# Patient Record
Sex: Female | Born: 1985 | Race: White | Hispanic: No | Marital: Married | Smoking: Former smoker
Health system: Southern US, Community
[De-identification: ages and names within clinical notes are randomized; demographics above are authoritative.]

## PROBLEM LIST (undated history)

## (undated) DIAGNOSIS — K219 Gastro-esophageal reflux disease without esophagitis: Secondary | ICD-10-CM

## (undated) DIAGNOSIS — F329 Major depressive disorder, single episode, unspecified: Secondary | ICD-10-CM

## (undated) DIAGNOSIS — R569 Unspecified convulsions: Secondary | ICD-10-CM

## (undated) DIAGNOSIS — T7840XA Allergy, unspecified, initial encounter: Secondary | ICD-10-CM

## (undated) DIAGNOSIS — F419 Anxiety disorder, unspecified: Secondary | ICD-10-CM

## (undated) DIAGNOSIS — N200 Calculus of kidney: Secondary | ICD-10-CM

## (undated) DIAGNOSIS — Z8041 Family history of malignant neoplasm of ovary: Secondary | ICD-10-CM

## (undated) DIAGNOSIS — R519 Headache, unspecified: Secondary | ICD-10-CM

## (undated) DIAGNOSIS — J45909 Unspecified asthma, uncomplicated: Secondary | ICD-10-CM

## (undated) DIAGNOSIS — F32A Depression, unspecified: Secondary | ICD-10-CM

## (undated) DIAGNOSIS — R51 Headache: Secondary | ICD-10-CM

## (undated) DIAGNOSIS — Z800 Family history of malignant neoplasm of digestive organs: Secondary | ICD-10-CM

## (undated) DIAGNOSIS — Z803 Family history of malignant neoplasm of breast: Secondary | ICD-10-CM

## (undated) HISTORY — DX: Major depressive disorder, single episode, unspecified: F32.9

## (undated) HISTORY — DX: Allergy, unspecified, initial encounter: T78.40XA

## (undated) HISTORY — DX: Headache, unspecified: R51.9

## (undated) HISTORY — DX: Gastro-esophageal reflux disease without esophagitis: K21.9

## (undated) HISTORY — DX: Anxiety disorder, unspecified: F41.9

## (undated) HISTORY — DX: Family history of malignant neoplasm of ovary: Z80.41

## (undated) HISTORY — DX: Family history of malignant neoplasm of digestive organs: Z80.0

## (undated) HISTORY — DX: Family history of malignant neoplasm of breast: Z80.3

## (undated) HISTORY — DX: Depression, unspecified: F32.A

## (undated) HISTORY — DX: Calculus of kidney: N20.0

## (undated) HISTORY — PX: TUBAL LIGATION: SHX77

## (undated) HISTORY — PX: CHOLECYSTECTOMY: SHX55

## (undated) HISTORY — DX: Headache: R51

---
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
## 2004-09-17 ENCOUNTER — Emergency Department: Payer: Self-pay | Admitting: Emergency Medicine

## 2004-12-08 ENCOUNTER — Emergency Department: Payer: Self-pay | Admitting: Unknown Physician Specialty

## 2005-02-03 ENCOUNTER — Emergency Department: Payer: Self-pay | Admitting: Emergency Medicine

## 2005-02-04 ENCOUNTER — Ambulatory Visit: Payer: Self-pay | Admitting: Emergency Medicine

## 2005-09-15 ENCOUNTER — Observation Stay: Payer: Self-pay

## 2005-09-19 ENCOUNTER — Observation Stay: Payer: Self-pay | Admitting: Obstetrics & Gynecology

## 2005-09-29 ENCOUNTER — Inpatient Hospital Stay: Payer: Self-pay | Admitting: Obstetrics & Gynecology

## 2006-07-05 ENCOUNTER — Emergency Department: Payer: Self-pay | Admitting: Emergency Medicine

## 2006-12-22 ENCOUNTER — Emergency Department: Payer: Self-pay | Admitting: Emergency Medicine

## 2007-02-21 ENCOUNTER — Emergency Department: Payer: Self-pay | Admitting: Internal Medicine

## 2007-03-01 ENCOUNTER — Emergency Department: Payer: Self-pay | Admitting: Emergency Medicine

## 2008-01-10 ENCOUNTER — Emergency Department: Payer: Self-pay | Admitting: Emergency Medicine

## 2009-03-15 ENCOUNTER — Emergency Department: Payer: Self-pay | Admitting: Emergency Medicine

## 2009-05-29 ENCOUNTER — Emergency Department: Payer: Self-pay | Admitting: Emergency Medicine

## 2012-04-20 HISTORY — PX: WISDOM TOOTH EXTRACTION: SHX21

## 2012-05-24 ENCOUNTER — Emergency Department: Payer: Self-pay | Admitting: Emergency Medicine

## 2012-08-30 ENCOUNTER — Emergency Department: Payer: Self-pay | Admitting: Emergency Medicine

## 2012-08-30 LAB — URINALYSIS, COMPLETE
Bacteria: NONE SEEN
Bilirubin,UR: NEGATIVE
Glucose,UR: NEGATIVE mg/dL (ref 0–75)
Leukocyte Esterase: NEGATIVE
Nitrite: NEGATIVE
Ph: 8 (ref 4.5–8.0)
Protein: NEGATIVE
RBC,UR: <1 /HPF (ref 0–5)
Specific Gravity: 1.018 (ref 1.003–1.030)
Squamous Epithelial: 4
WBC UR: 1 /HPF (ref 0–5)

## 2012-08-30 LAB — PREGNANCY, URINE: Pregnancy Test, Urine: NEGATIVE m[IU]/mL

## 2012-08-30 LAB — COMPREHENSIVE METABOLIC PANEL
Albumin: 3.6 g/dL (ref 3.4–5.0)
Alkaline Phosphatase: 70 U/L (ref 50–136)
Anion Gap: 5 — ABNORMAL LOW (ref 7–16)
BUN: 11 mg/dL (ref 7–18)
Bilirubin,Total: 0.3 mg/dL (ref 0.2–1.0)
Co2: 26 mmol/L (ref 21–32)
EGFR (African American): >60
EGFR (Non-African Amer.): >60
Glucose: 97 mg/dL (ref 65–99)
Osmolality: 281 (ref 275–301)
Potassium: 4.1 mmol/L (ref 3.5–5.1)
Sodium: 141 mmol/L (ref 136–145)
Total Protein: 7.1 g/dL (ref 6.4–8.2)

## 2012-08-30 LAB — CBC
MCHC: 34.3 g/dL (ref 32.0–36.0)
MCV: 90 fL (ref 80–100)
Platelet: 316 10*3/uL (ref 150–440)
RBC: 4.01 10*6/uL (ref 3.80–5.20)
WBC: 7 10*3/uL (ref 3.6–11.0)

## 2012-10-09 ENCOUNTER — Emergency Department: Payer: Self-pay | Admitting: Emergency Medicine

## 2012-10-09 LAB — DRUG SCREEN, URINE
Amphetamines, Ur Screen: NEGATIVE (ref ?–1000)
Barbiturates, Ur Screen: NEGATIVE (ref ?–200)
Benzodiazepine, Ur Scrn: NEGATIVE (ref ?–200)
Cannabinoid 50 Ng, Ur ~~LOC~~: NEGATIVE (ref ?–50)
Cocaine Metabolite,Ur ~~LOC~~: NEGATIVE (ref ?–300)
Methadone, Ur Screen: NEGATIVE (ref ?–300)
Opiate, Ur Screen: NEGATIVE (ref ?–300)
Phencyclidine (PCP) Ur S: NEGATIVE (ref ?–25)

## 2012-10-09 LAB — URINALYSIS, COMPLETE
Bacteria: NONE SEEN
Bilirubin,UR: NEGATIVE
Blood: NEGATIVE
Glucose,UR: NEGATIVE mg/dL (ref 0–75)
Nitrite: NEGATIVE
Ph: 5 (ref 4.5–8.0)
RBC,UR: NONE SEEN /HPF (ref 0–5)
WBC UR: 1 /HPF (ref 0–5)

## 2012-10-09 LAB — CBC
HCT: 35.9 % (ref 35.0–47.0)
MCH: 30.3 pg (ref 26.0–34.0)
MCV: 90 fL (ref 80–100)
Platelet: 300 10*3/uL (ref 150–440)
RBC: 4.01 10*6/uL (ref 3.80–5.20)
RDW: 13.6 % (ref 11.5–14.5)
WBC: 8.9 10*3/uL (ref 3.6–11.0)

## 2012-10-09 LAB — COMPREHENSIVE METABOLIC PANEL
BUN: 15 mg/dL (ref 7–18)
Calcium, Total: 9 mg/dL (ref 8.5–10.1)
Chloride: 105 mmol/L (ref 98–107)
Co2: 27 mmol/L (ref 21–32)
Creatinine: 0.92 mg/dL (ref 0.60–1.30)
Potassium: 3.7 mmol/L (ref 3.5–5.1)
SGOT(AST): 14 U/L — ABNORMAL LOW (ref 15–37)
SGPT (ALT): 23 U/L (ref 12–78)
Sodium: 141 mmol/L (ref 136–145)

## 2012-10-09 LAB — ETHANOL: Ethanol %: <0.003 % (ref 0.000–0.080)

## 2012-10-09 LAB — ACETAMINOPHEN LEVEL: Acetaminophen: <2 ug/mL

## 2012-10-09 LAB — TSH: Thyroid Stimulating Horm: 3.42 u[IU]/mL

## 2013-02-01 ENCOUNTER — Emergency Department: Payer: Self-pay | Admitting: Emergency Medicine

## 2013-02-01 LAB — URINALYSIS, COMPLETE
Nitrite: NEGATIVE
Ph: 9 (ref 4.5–8.0)
Protein: NEGATIVE
RBC,UR: 11 /HPF (ref 0–5)
Specific Gravity: 1.017 (ref 1.003–1.030)
Squamous Epithelial: 21
WBC UR: NONE SEEN /HPF (ref 0–5)

## 2013-02-01 LAB — COMPREHENSIVE METABOLIC PANEL
Albumin: 3.4 g/dL (ref 3.4–5.0)
Alkaline Phosphatase: 80 U/L (ref 50–136)
Anion Gap: 2 — ABNORMAL LOW (ref 7–16)
BUN: 8 mg/dL (ref 7–18)
Bilirubin,Total: 0.2 mg/dL (ref 0.2–1.0)
Calcium, Total: 8.9 mg/dL (ref 8.5–10.1)
Chloride: 107 mmol/L (ref 98–107)
Co2: 28 mmol/L (ref 21–32)
Creatinine: 0.71 mg/dL (ref 0.60–1.30)
Glucose: 87 mg/dL (ref 65–99)
Osmolality: 272 (ref 275–301)
SGOT(AST): 23 U/L (ref 15–37)
Sodium: 137 mmol/L (ref 136–145)

## 2013-02-01 LAB — CBC
HGB: 12 g/dL (ref 12.0–16.0)
MCHC: 35 g/dL (ref 32.0–36.0)
MCV: 90 fL (ref 80–100)
Platelet: 307 10*3/uL (ref 150–440)

## 2013-02-01 LAB — LIPASE, BLOOD: Lipase: 93 U/L (ref 73–393)

## 2013-02-09 ENCOUNTER — Emergency Department: Payer: Self-pay | Admitting: Emergency Medicine

## 2013-04-10 ENCOUNTER — Ambulatory Visit (INDEPENDENT_AMBULATORY_CARE_PROVIDER_SITE_OTHER): Payer: Medicaid Other | Admitting: General Surgery

## 2013-04-10 ENCOUNTER — Encounter: Payer: Self-pay | Admitting: General Surgery

## 2013-04-10 VITALS — BP 140/80 | HR 78 | Resp 14 | Ht 60.0 in | Wt 161.0 lb

## 2013-04-10 DIAGNOSIS — K8010 Calculus of gallbladder with chronic cholecystitis without obstruction: Secondary | ICD-10-CM

## 2013-04-10 NOTE — Progress Notes (Signed)
Patient ID: Laura Petersen, female   DOB: [birth year], 27 y.o.   MRN: [id number]  Chief Complaint  Patient presents with  . Other    gallstones    HPI Laura Petersen is a 27 y.o. female here today for a evaluation of gallstones .Patient had a ultrasound done about three months ago. She states she is having abdominal pain up under her right rib.Patient states she gets attacks three times a month, the pain last for about 2-3 hours.  HPI  Past Medical History  Diagnosis Date  . Depression   . GERD (gastroesophageal reflux disease)   . Allergy   . Kidney stones   . Headache   . Anxiety     Past Surgical History  Procedure Laterality Date  . Cesarean section    . Wisdom tooth extraction  2014    Family History  Problem Relation Age of Onset  . Breast cancer Maternal Aunt   . Breast cancer Paternal Aunt   . Cervical cancer Mother     Social History History  Substance Use Topics  . Smoking status: Former Smoker -- 1.00 packs/day for 6 years  . Smokeless tobacco: Never Used  . Alcohol Use: Yes    No Known Allergies  Current Outpatient Prescriptions  Medication Sig Dispense Refill  . clonazePAM (KLONOPIN) 0.5 MG tablet Take 0.5 mg by mouth 2 (two) times daily as needed for anxiety.      . DULoxetine (CYMBALTA) 30 MG capsule Take 30 mg by mouth daily.       No current facility-administered medications for this visit.    Review of Systems Review of Systems  Constitutional: Negative.   Respiratory: Negative.   Cardiovascular: Negative.   Gastrointestinal: Positive for nausea, abdominal pain and diarrhea. Negative for vomiting, blood in stool, abdominal distention, anal bleeding and rectal pain.    Blood pressure 140/80, pulse 78, resp. rate 14, height 5' (1.524 m), weight 161 lb (73.029 kg), last menstrual period 04/10/2013.  Physical Exam Physical Exam  Constitutional: She is oriented to person, place, and time. She appears well-developed and well-nourished.   Eyes: No scleral icterus.  Neck: No mass and no thyromegaly present.  Cardiovascular: Normal rate, regular rhythm and normal heart sounds.   Pulmonary/Chest: Breath sounds normal.  Abdominal: Soft. Normal appearance and bowel sounds are normal. There is no hepatosplenomegaly. There is no tenderness. No hernia.  Lymphadenopathy:    She has no cervical adenopathy.  Neurological: She is alert and oriented to person, place, and time.  Skin: Skin is warm and dry.    Data Reviewed Notes and ultrasound report reviewed. Gallstones noted.  Assessment    Symptomatic cholelithiasis     Plan    Discuss cholecystectomy. Reasons, procedure, risks and benefits explained. Pt is agreeable.    Patient's surgery has been scheduled for 04-19-13 at [facility].   Govind Furey G 04/11/2013, 5:57 AM

## 2013-04-10 NOTE — Patient Instructions (Addendum)

## 2013-04-11 ENCOUNTER — Encounter: Payer: Self-pay | Admitting: General Surgery

## 2013-04-11 ENCOUNTER — Other Ambulatory Visit: Payer: Self-pay | Admitting: *Deleted

## 2013-04-11 ENCOUNTER — Other Ambulatory Visit: Payer: Self-pay | Admitting: General Surgery

## 2013-04-11 DIAGNOSIS — K8010 Calculus of gallbladder with chronic cholecystitis without obstruction: Secondary | ICD-10-CM

## 2013-04-11 LAB — HEPATIC FUNCTION PANEL
AST: 21 U/L
Albumin: 4
Total Bilirubin: 0.2 mg/dL
Total Protein: 6.1 g/dL

## 2013-04-11 LAB — LIPASE: Lipase: 40 units/L (ref 0–53)

## 2013-04-11 LAB — CBC WITH DIFFERENTIAL
HCT: 34 %
MCHC: 33.1
RDW: 13.7

## 2013-04-11 NOTE — Progress Notes (Signed)
Patient will have the following labs drawn at [facility] lab today: CBC, Liver A, and Lipase.   She will be stopping by the office and will need to be walked back to the lab.

## 2013-04-12 LAB — HEPATIC FUNCTION PANEL
Albumin: 4 g/dL (ref 3.5–5.5)
Alkaline Phosphatase: 83 IU/L (ref 39–117)
Bilirubin, Direct: 0.05 mg/dL (ref 0.00–0.40)
Total Protein: 6.1 g/dL (ref 6.0–8.5)

## 2013-04-12 LAB — CBC WITH DIFFERENTIAL
Basophils Absolute: 0 10*3/uL (ref 0.0–0.2)
Basos: 0 %
HCT: 34.1 % (ref 34.0–46.6)
Hemoglobin: 11.3 g/dL (ref 11.1–15.9)
Lymphs: 37 %
MCH: 29.2 pg (ref 26.6–33.0)
MCHC: 33.1 g/dL (ref 31.5–35.7)
MCV: 88 fL (ref 79–97)
Monocytes Absolute: 0.4 10*3/uL (ref 0.1–0.9)
Monocytes: 7 %
Neutrophils Absolute: 3.5 10*3/uL (ref 1.4–7.0)
Platelets: 435 10*3/uL — ABNORMAL HIGH (ref 150–379)
RDW: 13.7 % (ref 12.3–15.4)
WBC: 6.4 10*3/uL (ref 3.4–10.8)

## 2013-04-19 ENCOUNTER — Ambulatory Visit: Payer: Self-pay | Admitting: General Surgery

## 2013-04-19 DIAGNOSIS — K8010 Calculus of gallbladder with chronic cholecystitis without obstruction: Secondary | ICD-10-CM

## 2013-04-21 LAB — PATHOLOGY REPORT

## 2013-04-24 ENCOUNTER — Encounter: Payer: Self-pay | Admitting: General Surgery

## 2013-04-26 ENCOUNTER — Ambulatory Visit: Payer: Medicaid Other | Admitting: General Surgery

## 2013-06-24 ENCOUNTER — Emergency Department: Payer: Self-pay | Admitting: Emergency Medicine

## 2013-06-24 LAB — CBC
HCT: 36.2 % (ref 35.0–47.0)
HGB: 11.9 g/dL — AB (ref 12.0–16.0)
MCH: 28.7 pg (ref 26.0–34.0)
MCHC: 32.8 g/dL (ref 32.0–36.0)
MCV: 88 fL (ref 80–100)
Platelet: 381 10*3/uL (ref 150–440)
RBC: 4.13 10*6/uL (ref 3.80–5.20)
RDW: 15.3 % — ABNORMAL HIGH (ref 11.5–14.5)
WBC: 9.2 10*3/uL (ref 3.6–11.0)

## 2013-06-24 LAB — DRUG SCREEN, URINE
AMPHETAMINES, UR SCREEN: NEGATIVE (ref ?–1000)
BENZODIAZEPINE, UR SCRN: NEGATIVE (ref ?–200)
Barbiturates, Ur Screen: NEGATIVE (ref ?–200)
CANNABINOID 50 NG, UR ~~LOC~~: NEGATIVE (ref ?–50)
COCAINE METABOLITE, UR ~~LOC~~: NEGATIVE (ref ?–300)
MDMA (ECSTASY) UR SCREEN: NEGATIVE (ref ?–500)
Methadone, Ur Screen: NEGATIVE (ref ?–300)
OPIATE, UR SCREEN: NEGATIVE (ref ?–300)
Phencyclidine (PCP) Ur S: NEGATIVE (ref ?–25)
Tricyclic, Ur Screen: POSITIVE (ref ?–1000)

## 2013-06-24 LAB — COMPREHENSIVE METABOLIC PANEL
ALK PHOS: 80 U/L
ALT: 26 U/L (ref 12–78)
Albumin: 4.2 g/dL (ref 3.4–5.0)
Anion Gap: 12 (ref 7–16)
BILIRUBIN TOTAL: 0.3 mg/dL (ref 0.2–1.0)
BUN: 12 mg/dL (ref 7–18)
Calcium, Total: 8.5 mg/dL (ref 8.5–10.1)
Chloride: 108 mmol/L — ABNORMAL HIGH (ref 98–107)
Co2: 27 mmol/L (ref 21–32)
Creatinine: 0.85 mg/dL (ref 0.60–1.30)
EGFR (African American): >60
GLUCOSE: 63 mg/dL — AB (ref 65–99)
Osmolality: 290 (ref 275–301)
Potassium: 3.4 mmol/L — ABNORMAL LOW (ref 3.5–5.1)
SGOT(AST): 21 U/L (ref 15–37)
SODIUM: 147 mmol/L — AB (ref 136–145)
TOTAL PROTEIN: 7.7 g/dL (ref 6.4–8.2)

## 2013-06-24 LAB — SALICYLATE LEVEL: Salicylates, Serum: <1.7 mg/dL

## 2013-06-24 LAB — URINALYSIS, COMPLETE
BLOOD: NEGATIVE
Bilirubin,UR: NEGATIVE
GLUCOSE, UR: NEGATIVE mg/dL (ref 0–75)
Ketone: NEGATIVE
Leukocyte Esterase: NEGATIVE
Nitrite: NEGATIVE
Ph: 5 (ref 4.5–8.0)
Protein: NEGATIVE
RBC,UR: 1 /HPF (ref 0–5)
Specific Gravity: 1.011 (ref 1.003–1.030)
WBC UR: <1 /HPF (ref 0–5)

## 2013-06-24 LAB — ETHANOL: Ethanol %: <0.003 % (ref 0.000–0.080)

## 2013-06-24 LAB — ACETAMINOPHEN LEVEL: Acetaminophen: <2 ug/mL

## 2013-06-24 LAB — TSH: Thyroid Stimulating Horm: 2.92 u[IU]/mL

## 2013-06-24 LAB — PREGNANCY, URINE: Pregnancy Test, Urine: NEGATIVE m[IU]/mL

## 2013-12-16 ENCOUNTER — Emergency Department: Payer: Self-pay | Admitting: Emergency Medicine

## 2013-12-16 LAB — BASIC METABOLIC PANEL
ANION GAP: 8 (ref 7–16)
BUN: 10 mg/dL (ref 7–18)
CALCIUM: 8.2 mg/dL — AB (ref 8.5–10.1)
CO2: 24 mmol/L (ref 21–32)
CREATININE: 0.83 mg/dL (ref 0.60–1.30)
Chloride: 108 mmol/L — ABNORMAL HIGH (ref 98–107)
EGFR (Non-African Amer.): >60
Glucose: 108 mg/dL — ABNORMAL HIGH (ref 65–99)
OSMOLALITY: 279 (ref 275–301)
POTASSIUM: 3.6 mmol/L (ref 3.5–5.1)
Sodium: 140 mmol/L (ref 136–145)

## 2013-12-16 LAB — CBC WITH DIFFERENTIAL/PLATELET
BASOS PCT: 0.8 %
Basophil #: 0.1 10*3/uL (ref 0.0–0.1)
Eosinophil #: 0 10*3/uL (ref 0.0–0.7)
Eosinophil %: 0.3 %
HCT: 35 % (ref 35.0–47.0)
HGB: 11.5 g/dL — AB (ref 12.0–16.0)
Lymphocyte #: 2.8 10*3/uL (ref 1.0–3.6)
Lymphocyte %: 24.4 %
MCH: 28.5 pg (ref 26.0–34.0)
MCHC: 32.9 g/dL (ref 32.0–36.0)
MCV: 87 fL (ref 80–100)
MONOS PCT: 5 %
Monocyte #: 0.6 x10 3/mm (ref 0.2–0.9)
Neutrophil #: 8 10*3/uL — ABNORMAL HIGH (ref 1.4–6.5)
Neutrophil %: 69.5 %
Platelet: 439 10*3/uL (ref 150–440)
RBC: 4.04 10*6/uL (ref 3.80–5.20)
RDW: 15.3 % — ABNORMAL HIGH (ref 11.5–14.5)
WBC: 11.5 10*3/uL — ABNORMAL HIGH (ref 3.6–11.0)

## 2013-12-17 LAB — TROPONIN I: Troponin-I: <0.02 ng/mL

## 2014-02-12 ENCOUNTER — Emergency Department: Payer: Self-pay | Admitting: Emergency Medicine

## 2014-02-12 LAB — URINALYSIS, COMPLETE
Bilirubin,UR: NEGATIVE
Blood: NEGATIVE
GLUCOSE, UR: NEGATIVE mg/dL (ref 0–75)
LEUKOCYTE ESTERASE: NEGATIVE
NITRITE: NEGATIVE
Ph: 5 (ref 4.5–8.0)
Protein: 30
RBC,UR: 1 /HPF (ref 0–5)
SPECIFIC GRAVITY: 1.026 (ref 1.003–1.030)
Squamous Epithelial: 13
WBC UR: 3 /HPF (ref 0–5)

## 2014-02-12 LAB — CBC WITH DIFFERENTIAL/PLATELET
Basophil #: 0 10*3/uL (ref 0.0–0.1)
Basophil %: 0.3 %
EOS PCT: 0.4 %
Eosinophil #: 0 10*3/uL (ref 0.0–0.7)
HCT: 36.5 % (ref 35.0–47.0)
HGB: 11.8 g/dL — ABNORMAL LOW (ref 12.0–16.0)
Lymphocyte #: 1.8 10*3/uL (ref 1.0–3.6)
Lymphocyte %: 20.6 %
MCH: 28.3 pg (ref 26.0–34.0)
MCHC: 32.4 g/dL (ref 32.0–36.0)
MCV: 88 fL (ref 80–100)
MONOS PCT: 4.1 %
Monocyte #: 0.4 x10 3/mm (ref 0.2–0.9)
NEUTROS ABS: 6.5 10*3/uL (ref 1.4–6.5)
Neutrophil %: 74.6 %
Platelet: 409 10*3/uL (ref 150–440)
RBC: 4.17 10*6/uL (ref 3.80–5.20)
RDW: 16.5 % — ABNORMAL HIGH (ref 11.5–14.5)
WBC: 8.7 10*3/uL (ref 3.6–11.0)

## 2014-02-12 LAB — BASIC METABOLIC PANEL
ANION GAP: 10 (ref 7–16)
BUN: 11 mg/dL (ref 7–18)
CALCIUM: 8.3 mg/dL — AB (ref 8.5–10.1)
CHLORIDE: 107 mmol/L (ref 98–107)
CO2: 25 mmol/L (ref 21–32)
Creatinine: 0.88 mg/dL (ref 0.60–1.30)
EGFR (Non-African Amer.): >60
Glucose: 100 mg/dL — ABNORMAL HIGH (ref 65–99)
Osmolality: 283 (ref 275–301)
Potassium: 3.5 mmol/L (ref 3.5–5.1)
SODIUM: 142 mmol/L (ref 136–145)

## 2014-02-12 LAB — HCG, QUANTITATIVE, PREGNANCY: Beta Hcg, Quant.: <1 m[IU]/mL — ABNORMAL LOW

## 2014-02-12 LAB — WET PREP, GENITAL

## 2014-02-13 LAB — GC/CHLAMYDIA PROBE AMP

## 2014-02-19 ENCOUNTER — Encounter: Payer: Self-pay | Admitting: General Surgery

## 2014-03-31 ENCOUNTER — Emergency Department: Payer: Self-pay | Admitting: Emergency Medicine

## 2014-08-11 NOTE — Consult Note (Signed)
PATIENT NAME:  Laura Petersen, Laura Petersen MR#:  [id number] DATE OF BIRTH:  31-Dec-1985  AGE:  29 years  SEX:  Female  RACE:  White  CONSULTING PHYSICIAN:  Emeli Goguen K. Franchot Mimes, MD  DATE OF DICTATION: 06/24/2013  PLACE OF DICTATION: [place] [address], [place], [place]:  06/24/2013    SUBJECTIVE:  The patient was seen in consultation in [place] [address]. 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 The patient is in the process of being divorced after being married for many years. The patient currently has a boyfriend who is 44 years old and has a job. The patient was brought to the emergency room with the chief complaint "overdosed on Klonopin."  HISTORY OF PRESENT ILLNESS:  The patient reports that she is stressed out because her boyfriend is very jealous, and whenever she tries to talk to another man, he feels very jealous and she is not able to deal with the stress. The patient reports that she was frustrated, so she took a few Klonopin, and she told her boyfriend, who told his friend, and they decided to bring her here for help.  PAST PSYCHIATRIC HISTORY:  History of inpatient psychiatry when she was 29 years old at [facility] for 34 days when she was depressed and had suicidal thoughts and behavior. Being followed by Dr. Brunetta Genera, who gives her Klonopin for help. The patient does not want to see a female therapist, so she does not like to go to any local mental health agency.   ALCOHOL AND DRUGS:  Denies drinking alcohol, denies street or prescription drug abuse. Quit smoking nicotine cigarettes some time ago.  MENTAL STATUS EXAMINATION: The patient is dressed in hospital scrubs. Alert and oriented to place, person, time. Calm, pleasant, and cooperative.  No agitation. Affect is  neutral. Mood stable. The patient reports that she did not take 20 tablets of Klonopin, ( and she took just a few of them and not all of them. When she told her boyfriend and he told his friend, they all made up the story and she did not take all 20 tablets of Klonopin. She says she is not suicidal, and does not want to kill herself, as she has 3 children to take care of. In addition, her boyfriend has 2 children from his previous relationship, and she needs to take care of them. No psychosis. Denies auditory or visual hallucinations. Memory is intact. Cognition is intact. Contracts for safety. Is eager to go home and go back to her job, and get help as needed.  IMPRESSION: Anxiety disorder stable at this time PLAN:  Discontinue IVC as patient contracts for safety. Discharge patient to go back home. Her mother is going to come and get her. She is eager to go back to her boyfriend, and she will keep her follow up appointment with her primary care physician, Dr. Brunetta Genera, who will recommend her to appropriate mental health agency in the community. She prefers a female therapist, and he will take care of it.   ____________________________ Wallace Cullens. Franchot Mimes, MD skc:mr D: 06/24/2013 16:40:52 ET T: 06/24/2013 19:54:08 ET JOB#: [id number]  cc: Arlyn Leak K. Franchot Mimes, MD, <Dictator> Dewain Penning MD ELECTRONICALLY SIGNED 06/25/2013 19:56

## 2014-08-11 NOTE — Op Note (Signed)
PATIENT NAME:  Laura Petersen, Laura Petersen MR#:  [id number] DATE OF BIRTH:  [birth year]  DATE OF PROCEDURE:  04/19/2013  PREOPERATIVE DIAGNOSIS: Cholelithiasis and chronic cholecystitis.   POSTOPERATIVE DIAGNOSIS: Cholelithiasis and chronic cholecystitis.   OPERATION: Laparoscopy, cholecystectomy, cholangiogram.  SURGEON: Mckinley Jewel, MD   ANESTHESIA: General.   COMPLICATIONS: None.   ESTIMATED BLOOD LOSS: Minimal.   DRAINS: None.   DESCRIPTION OF PROCEDURE: The patient was placed in the supine position on the operating table and put to sleep with an endotracheal tube. The abdomen was prepped and draped out as a sterile field. Timeout was performed. A small incision made above the umbilicus and a Veress needle with the InnerDyne sleeve positioned in the peritoneal cavity successfully, verified with the hanging drop method. Pneumoperitoneum was obtained and a 10 mm port was placed. The camera was introduced with good visualization. Epigastric and 2 lateral 5 mm ports were placed. There were no grossly abnormal findings of the liver or the adjoining part of the stomach, colon in this area; however, the gallbladder was moderately distended and thick in its wall and an adhesion of fatty tissue was noted along the medial edge of the gallbladder and the liver. With careful exposure, these adhesions were taken down. The duodenum was tented up slightly with the small adhesion and this was freed. The gallbladder was then retracted cephalad. The Hartmann's pouch area was pulled up. The cystic duct was easily identified and freed and appeared to be relatively narrow in size, but fairly firm in consistency. The common bile duct appeared to be normal in size. The Kumar clamp and catheter were positioned and cholangiogram was attempted, but the preferential flow was into the gallbladder. Some amount of dye did fill the bile duct, which appeared to be a very narrow duct, and could not follow this all the way both proximally  and distally. In spite of repeated manipulations of the gallbladder and the clamp, the duct not fill any further than what was seen already. Since the patient had no evidence of common duct involvement by previous labs, further attempts were not made. The cystic duct was hemoclipped clipped and cut. The gallbladder was decompressed with the use of the catheter and the catheter was removed. The cystic artery was identified, freed, hemoclipped and cut. The gallbladder was dissected free from its bed using cautery for control of bleeding. Following this, the area was irrigated with some saline and all fluid suctioned out from below and lateral to the liver. The gallbladder was placed in a retrieval bag and brought out through the umbilical port site and, again noted to contain multiple stones up 3 to 4 mm size. The ports were then removed and pneumoperitoneum was released. The fascial opening near the umbilicus was extremely small and was therefore not closed. The skin incisions were closed with subcuticular 4-0 Vicryl, reinforced with Steri-Strips and dry sterile dressing placed. The patient tolerated the procedure well. There were no immediate problems. She was returned to the recovery room in stable condition.    ____________________________ S.Robinette Haines, MD sgs:ce D: 04/19/2013 15:02:01 ET T: 04/19/2013 19:27:52 ET JOB#: [id number]  cc: S.G. Jamal Collin, MD, <Dictator> [facility] Robinette Haines MD ELECTRONICALLY SIGNED 04/26/2013 8:56

## 2015-07-29 ENCOUNTER — Emergency Department
Admission: EM | Admit: 2015-07-29 | Discharge: 2015-07-29 | Disposition: A | Discharge status: Home / Self Care | Payer: Self-pay | Attending: Emergency Medicine | Admitting: Emergency Medicine

## 2015-07-29 DIAGNOSIS — Z79899 Other long term (current) drug therapy: Secondary | ICD-10-CM | POA: Insufficient documentation

## 2015-07-29 DIAGNOSIS — F329 Major depressive disorder, single episode, unspecified: Secondary | ICD-10-CM | POA: Insufficient documentation

## 2015-07-29 DIAGNOSIS — R42 Dizziness and giddiness: Secondary | ICD-10-CM

## 2015-07-29 DIAGNOSIS — Z87891 Personal history of nicotine dependence: Secondary | ICD-10-CM | POA: Insufficient documentation

## 2015-07-29 DIAGNOSIS — J45909 Unspecified asthma, uncomplicated: Secondary | ICD-10-CM | POA: Insufficient documentation

## 2015-07-29 HISTORY — DX: Unspecified asthma, uncomplicated: J45.909

## 2015-07-29 LAB — URINALYSIS COMPLETE WITH MICROSCOPIC (ARMC ONLY)
Bilirubin Urine: NEGATIVE
GLUCOSE, UA: NEGATIVE mg/dL
HGB URINE DIPSTICK: NEGATIVE
Ketones, ur: NEGATIVE mg/dL
LEUKOCYTES UA: NEGATIVE
NITRITE: NEGATIVE
PH: 5 (ref 5.0–8.0)
Protein, ur: NEGATIVE mg/dL
SPECIFIC GRAVITY, URINE: 1.026 (ref 1.005–1.030)

## 2015-07-29 LAB — BASIC METABOLIC PANEL
ANION GAP: 4 — AB (ref 5–15)
BUN: 13 mg/dL (ref 6–20)
CHLORIDE: 110 mmol/L (ref 101–111)
CO2: 23 mmol/L (ref 22–32)
Calcium: 8.5 mg/dL — ABNORMAL LOW (ref 8.9–10.3)
Creatinine, Ser: 0.74 mg/dL (ref 0.44–1.00)
Glucose, Bld: 97 mg/dL (ref 65–99)
POTASSIUM: 3.6 mmol/L (ref 3.5–5.1)
SODIUM: 137 mmol/L (ref 135–145)

## 2015-07-29 LAB — CBC
HEMATOCRIT: 34.8 % — AB (ref 35.0–47.0)
HEMOGLOBIN: 11.8 g/dL — AB (ref 12.0–16.0)
MCH: 29 pg (ref 26.0–34.0)
MCHC: 33.8 g/dL (ref 32.0–36.0)
MCV: 85.7 fL (ref 80.0–100.0)
Platelets: 395 10*3/uL (ref 150–440)
RBC: 4.06 MIL/uL (ref 3.80–5.20)
RDW: 15.9 % — AB (ref 11.5–14.5)
WBC: 7.1 10*3/uL (ref 3.6–11.0)

## 2015-07-29 LAB — POCT PREGNANCY, URINE: PREG TEST UR: NEGATIVE

## 2015-07-29 MED ORDER — CLONAZEPAM 0.5 MG PO TABS: 0.5000 mg | ORAL_TABLET | Freq: Two times a day (BID) | ORAL | Status: DC

## 2015-07-29 NOTE — ED Notes (Signed)
Pt c/o intermittent feeling like heart racing, dizziness "like I might pass out" since around 0830am. States she ate breakfast but that did not help.. Denies Hx of similar sx.. Denies having energy drink or dietary supplement.Marland Kitchen

## 2015-07-29 NOTE — Discharge Instructions (Signed)
Dizziness °Dizziness is a common problem. It is a feeling of unsteadiness or light-headedness. You may feel like you are about to faint. Dizziness can lead to injury if you stumble or fall. Anyone can become dizzy, but dizziness is more common in older adults. This condition can be caused by a number of things, including medicines, dehydration, or illness. °HOME CARE INSTRUCTIONS °Taking these steps may help with your condition: °Eating and Drinking °· Drink enough fluid to keep your urine clear or pale yellow. This helps to keep you from becoming dehydrated. Try to drink more clear fluids, such as water. °· Do not drink alcohol. °· Limit your caffeine intake if directed by your health care provider. °· Limit your salt intake if directed by your health care provider. °Activity °· Avoid making quick movements. °· Rise slowly from chairs and steady yourself until you feel okay. °· In the morning, first sit up on the side of the bed. When you feel okay, stand slowly while you hold onto something until you know that your balance is fine. °· Move your legs often if you need to stand in one place for a long time. Tighten and relax your muscles in your legs while you are standing. °· Do not drive or operate heavy machinery if you feel dizzy. °· Avoid bending down if you feel dizzy. Place items in your home so that they are easy for you to reach without leaning over. °Lifestyle °· Do not use any tobacco products, including cigarettes, chewing tobacco, or electronic cigarettes. If you need help quitting, ask your health care provider. °· Try to reduce your stress level, such as with yoga or meditation. Talk with your health care provider if you need help. °General Instructions °· Watch your dizziness for any changes. °· Take medicines only as directed by your health care provider. Talk with your health care provider if you think that your dizziness is caused by a medicine that you are taking. °· Tell a friend or a family  member that you are feeling dizzy. If he or she notices any changes in your behavior, have this person call your health care provider. °· Keep all follow-up visits as directed by your health care provider. This is important. °SEEK MEDICAL CARE IF: °· Your dizziness does not go away. °· Your dizziness or light-headedness gets worse. °· You feel nauseous. °· You have reduced hearing. °· You have new symptoms. °· You are unsteady on your feet or you feel like the room is spinning. °SEEK IMMEDIATE MEDICAL CARE IF: °· You vomit or have diarrhea and are unable to eat or drink anything. °· You have problems talking, walking, swallowing, or using your arms, hands, or legs. °· You feel generally weak. °· You are not thinking clearly or you have trouble forming sentences. It may take a friend or family member to notice this. °· You have chest pain, abdominal pain, shortness of breath, or sweating. °· Your vision changes. °· You notice any bleeding. °· You have a headache. °· You have neck pain or a stiff neck. °· You have a fever. °  °This information is not intended to replace advice given to you by your health care provider. Make sure you discuss any questions you have with your health care provider. °  °Document Released: 09/30/2000 Document Revised: 08/21/2014 Document Reviewed: 04/02/2014 °Elsevier Interactive Patient Education ©2016 Else[employer] Inc[employer] ° °Panic Attacks °Panic attacks are sudden, short-lived surges of severe anxiety, fear, or discomfort. They may occur for no   reason when you are relaxed, when you are anxious, or when you are sleeping. Panic attacks may occur for a number of reasons:  °· Healthy people occasionally have panic attacks in extreme, life-threatening situations, such as war or natural disasters. Normal anxiety is a protective mechanism of the body that helps us react to danger (fight or flight response). °· Panic attacks are often seen with anxiety disorders, such as panic disorder, social anxiety  disorder, generalized anxiety disorder, and phobias. Anxiety disorders cause excessive or uncontrollable anxiety. They may interfere with your relationships or other life activities. °· Panic attacks are sometimes seen with other mental illnesses, such as depression and posttraumatic stress disorder. °· Certain medical conditions, prescription medicines, and drugs of abuse can cause panic attacks. °SYMPTOMS  °Panic attacks start suddenly, peak within 20 minutes, and are accompanied by four or more of the following symptoms: °· Pounding heart or fast heart rate (palpitations). °· Sweating. °· Trembling or shaking. °· Shortness of breath or feeling smothered. °· Feeling choked. °· Chest pain or discomfort. °· Nausea or strange feeling in your stomach. °· Dizziness, light-headedness, or feeling like you will faint. °· Chills or hot flushes. °· Numbness or tingling in your lips or hands and feet. °· Feeling that things are not real or feeling that you are not yourself. °· Fear of losing control or going crazy. °· Fear of dying. °Some of these symptoms can mimic serious medical conditions. For example, you may think you are having a heart attack. Although panic attacks can be very scary, they are not life threatening. °DIAGNOSIS  °Panic attacks are diagnosed through an assessment by your health care provider. Your health care provider will ask questions about your symptoms, such as where and when they occurred. Your health care provider will also ask about your medical history and use of alcohol and drugs, including prescription medicines. Your health care provider may order blood tests or other studies to rule out a serious medical condition. Your health care provider may refer you to a mental health professional for further evaluation. °TREATMENT  °· Most healthy people who have one or two panic attacks in an extreme, life-threatening situation will not require treatment. °· The treatment for panic attacks associated  with anxiety disorders or other mental illness typically involves counseling with a mental health professional, medicine, or a combination of both. Your health care provider will help determine what treatment is best for you. °· Panic attacks due to physical illness usually go away with treatment of the illness. If prescription medicine is causing panic attacks, talk with your health care provider about stopping the medicine, decreasing the dose, or substituting another medicine. °· Panic attacks due to alcohol or drug abuse go away with abstinence. Some adults need professional help in order to stop drinking or using drugs. °HOME CARE INSTRUCTIONS  °· Take all medicines as directed by your health care provider.   °· Schedule and attend follow-up visits as directed by your health care provider. It is important to keep all your appointments. °SEEK MEDICAL CARE IF: °· You are not able to take your medicines as prescribed. °· Your symptoms do not improve or get worse. °SEEK IMMEDIATE MEDICAL CARE IF:  °· You experience panic attack symptoms that are different than your usual symptoms. °· You have serious thoughts about hurting yourself or others. °· You are taking medicine for panic attacks and have a serious side effect. °MAKE SURE YOU: °· Understand these instructions. °· Will watch your condition. °·   Will get help right away if you are not doing well or get worse. °  °This information is not intended to replace advice given to you by your health care provider. Make sure you discuss any questions you have with your health care provider. °  °Document Released: 04/06/2005 Document Revised: 04/11/2013 Document Reviewed: 11/18/2012 °Elsevier Interactive Patient Education ©2016 Elsevier Inc. ° °

## 2015-07-29 NOTE — ED Provider Notes (Signed)
[facility] Emergency Department Provider Note     Time seen: ----------------------------------------- 3:35 PM on 07/29/2015 -----------------------------------------    I have reviewed the triage vital signs and the nursing notes.   HISTORY  Chief Complaint Dizziness    HPI Laura Petersen is a 30 y.o. female who presents ER for dizziness and sleepiness. Patient states the tingling in her cheeks, this began while she was at work. She intermittently felt like her heart was racing, that she might pass out since this morning. She ate breakfast but that did not help, she does have a history of panic attacks but states this does not feel like that.   Past Medical History  Diagnosis Date  . Depression   . GERD (gastroesophageal reflux disease)   . Allergy   . Kidney stones   . Headache   . Anxiety   . Asthma     Patient Active Problem List   Diagnosis Date Noted  . Calculus of gallbladder with other cholecystitis, without mention of obstruction 04/11/2013    Past Surgical History  Procedure Laterality Date  . Cesarean section    . Wisdom tooth extraction  2014  . Cholecystectomy      Allergies Review of patient's allergies indicates no known allergies.  Social History Social History  Substance Use Topics  . Smoking status: Former Smoker -- 1.00 packs/day for 6 years  . Smokeless tobacco: Never Used  . Alcohol Use: Yes    Review of Systems Constitutional: Negative for fever. Eyes: Negative for visual changes. ENT: Negative for sore throat. Cardiovascular: Negative for chest pain. Respiratory: Negative for shortness of breath. Gastrointestinal: Negative for abdominal pain, vomiting and diarrhea. Genitourinary: Negative for dysuria. Musculoskeletal: Negative for back pain. Skin: Negative for rash. Neurological: Negative for headaches, positive for tingling and weakness  10-point ROS otherwise  negative.  ____________________________________________   PHYSICAL EXAM:  VITAL SIGNS: ED Triage Vitals  Enc Vitals Group     BP 07/29/15 1348 124/84 mmHg     Pulse Rate 07/29/15 1348 79     Resp 07/29/15 1348 17     Temp 07/29/15 1348 98 F (36.7 C)     Temp Source 07/29/15 1348 Oral     SpO2 07/29/15 1348 100 %     Weight 07/29/15 1348 142 lb (64.411 kg)     Height 07/29/15 1348 4\' 11"  (1.499 m)     Head Cir --      Peak Flow --      Pain Score --      Pain Loc --      Pain Edu? --      Excl. in [place]? --    Constitutional: Alert and oriented. Well appearing and in no distress. Eyes: Conjunctivae are normal. PERRL. Normal extraocular movements. ENT   Head: Normocephalic and atraumatic.   Nose: No congestion/rhinnorhea.   Mouth/Throat: Mucous membranes are moist.   Neck: No stridor. Cardiovascular: Normal rate, regular rhythm. No murmurs, rubs, or gallops. Respiratory: Normal respiratory effort without tachypnea nor retractions. Breath sounds are clear and equal bilaterally. No wheezes/rales/rhonchi. Gastrointestinal: Soft and nontender. Normal bowel sounds Musculoskeletal: Nontender with normal range of motion in all extremities. No lower extremity tenderness nor edema. Neurologic:  Normal speech and language. No gross focal neurologic deficits are appreciated.  Skin:  Skin is warm, dry and intact. No rash noted. Psychiatric: Mood and affect are normal. Speech and behavior are normal.  ____________________________________________  EKG: Interpreted by me. Normal sinus rhythm  with a rate of 78 bpm, normal PR interval, normal QRS, normal QT interval. Normal axis. Nonspecific T-wave changes.  ____________________________________________  ED COURSE:  Pertinent labs & imaging results that were available during my care of the patient were reviewed by me and considered in my medical decision making (see chart for details). Patient is in no acute distress, will  check basic labs and reevaluate. ____________________________________________    LABS (pertinent positives/negatives)  Labs Reviewed  BASIC METABOLIC PANEL - Abnormal; Notable for the following:    Calcium 8.5 (*)    Anion gap 4 (*)    All other components within normal limits  CBC - Abnormal; Notable for the following:    Hemoglobin 11.8 (*)    HCT 34.8 (*)    RDW 15.9 (*)    All other components within normal limits  URINALYSIS COMPLETEWITH MICROSCOPIC ([facility] ONLY) - Abnormal; Notable for the following:    Color, Urine YELLOW (*)    APPearance CLOUDY (*)    Bacteria, UA RARE (*)    Squamous Epithelial / LPF TOO NUMEROUS TO COUNT (*)    All other components within normal limits  POC URINE PREG, ED  POCT PREGNANCY, URINE  ____________________________________________  FINAL ASSESSMENT AND PLAN  Dizziness  Plan: Patient with labs as dictated above. Patient is in no acute distress, will prescribe some Klonopin for her to take as needed. This is most likely anxiety related, she stable for outpatient follow-up with her doctor.   Earleen Newport, MD   Earleen Newport, MD 07/29/15 1600

## 2015-07-29 NOTE — ED Notes (Signed)
States dizziness and feeling sleepy, states tingling in her cheeks, began at work this AM, pt awake and alert in no acute distress

## 2015-09-30 ENCOUNTER — Emergency Department
Admission: EM | Admit: 2015-09-30 | Discharge: 2015-09-30 | Disposition: A | Discharge status: Home / Self Care | Payer: Self-pay | Attending: Emergency Medicine | Admitting: Emergency Medicine

## 2015-09-30 ENCOUNTER — Encounter: Payer: Self-pay | Admitting: Emergency Medicine

## 2015-09-30 DIAGNOSIS — H44002 Unspecified purulent endophthalmitis, left eye: Secondary | ICD-10-CM | POA: Insufficient documentation

## 2015-09-30 DIAGNOSIS — W57XXXA Bitten or stung by nonvenomous insect and other nonvenomous arthropods, initial encounter: Secondary | ICD-10-CM | POA: Insufficient documentation

## 2015-09-30 DIAGNOSIS — Y999 Unspecified external cause status: Secondary | ICD-10-CM | POA: Insufficient documentation

## 2015-09-30 DIAGNOSIS — S00262A Insect bite (nonvenomous) of left eyelid and periocular area, initial encounter: Secondary | ICD-10-CM | POA: Insufficient documentation

## 2015-09-30 DIAGNOSIS — Z87891 Personal history of nicotine dependence: Secondary | ICD-10-CM | POA: Insufficient documentation

## 2015-09-30 DIAGNOSIS — Y939 Activity, unspecified: Secondary | ICD-10-CM | POA: Insufficient documentation

## 2015-09-30 DIAGNOSIS — Z79899 Other long term (current) drug therapy: Secondary | ICD-10-CM | POA: Insufficient documentation

## 2015-09-30 DIAGNOSIS — F329 Major depressive disorder, single episode, unspecified: Secondary | ICD-10-CM | POA: Insufficient documentation

## 2015-09-30 DIAGNOSIS — J45909 Unspecified asthma, uncomplicated: Secondary | ICD-10-CM | POA: Insufficient documentation

## 2015-09-30 DIAGNOSIS — Y929 Unspecified place or not applicable: Secondary | ICD-10-CM | POA: Insufficient documentation

## 2015-09-30 MED ORDER — HYDROCODONE-ACETAMINOPHEN 5-325 MG PO TABS: 1.0000 | ORAL_TABLET | ORAL | Status: DC | PRN

## 2015-09-30 MED ORDER — IBUPROFEN 800 MG PO TABS: 800.0000 mg | ORAL_TABLET | Freq: Three times a day (TID) | ORAL | Status: DC | PRN

## 2015-09-30 MED ORDER — SULFAMETHOXAZOLE-TRIMETHOPRIM 800-160 MG PO TABS: 1.0000 | ORAL_TABLET | Freq: Two times a day (BID) | ORAL | Status: DC

## 2015-09-30 NOTE — ED Provider Notes (Signed)
[facility] Emergency Department Provider Note  ____________________________________________  Time seen: Approximately 8:29 AM  I have reviewed the triage vital signs and the nursing notes.   HISTORY  Chief Complaint No chief complaint on file.    HPI Laura Petersen is a 30 y.o. female presents for evaluation of pain and swelling to her left eye since yesterday. Started noticing some blurry vision this morning. Denies any redness,  drainage. Patient reports that she thinks she may been bitten by mosquitoes near her eye. She has noticed a little red bump on the inner aspect of her nose near the eye   Past Medical History  Diagnosis Date  . Depression   . GERD (gastroesophageal reflux disease)   . Allergy   . Kidney stones   . Headache   . Anxiety   . Asthma     Patient Active Problem List   Diagnosis Date Noted  . Calculus of gallbladder with other cholecystitis, without mention of obstruction 04/11/2013    Past Surgical History  Procedure Laterality Date  . Cesarean section    . [place] tooth extraction  2014  . Cholecystectomy      Current Outpatient Rx  Name  Route  Sig  Dispense  Refill  . clonazePAM (KLONOPIN) 0.5 MG tablet   Oral   Take 0.5 mg by mouth 2 (two) times daily as needed for anxiety.         . clonazePAM (KLONOPIN) 0.5 MG tablet   Oral   Take 1 tablet (0.5 mg total) by mouth 2 (two) times daily.   20 tablet   0   . DULoxetine (CYMBALTA) 30 MG capsule   Oral   Take 30 mg by mouth daily.         Marland Kitchen HYDROcodone-acetaminophen (NORCO) 5-325 MG tablet   Oral   Take 1-2 tablets by mouth every 4 (four) hours as needed for moderate pain.   15 tablet   0   . ibuprofen (ADVIL,MOTRIN) 800 MG tablet   Oral   Take 1 tablet (800 mg total) by mouth every 8 (eight) hours as needed.   30 tablet   0   . sulfamethoxazole-trimethoprim (BACTRIM DS,SEPTRA DS) 800-160 MG tablet   Oral   Take 1 tablet by mouth 2 (two) times  daily.   20 tablet   0     Allergies Review of patient's allergies indicates no known allergies.  Family History  Problem Relation Age of Onset  . Breast cancer Maternal Aunt   . Breast cancer Paternal Aunt   . Cervical cancer Mother     Social History Social History  Substance Use Topics  . Smoking status: Former Smoker -- 1.00 packs/day for 6 years  . Smokeless tobacco: Never Used  . Alcohol Use: Yes    Review of Systems Constitutional: No fever/chills Eyes: No visual changes.Positive left hip pain. ENT: No sore throat. Skin: Negative for rash. Neurological: Negative for headaches, focal weakness or numbness.  10-point ROS otherwise negative.  ____________________________________________   PHYSICAL EXAM:  VITAL SIGNS: ED Triage Vitals  Enc Vitals Group     BP 09/30/15 0810 112/59 mmHg     Pulse Rate 09/30/15 0810 69     Resp 09/30/15 0810 18     Temp 09/30/15 0810 97.6 F (36.4 C)     Temp Source 09/30/15 0810 Oral     SpO2 09/30/15 0810 100 %     Weight 09/30/15 0812 142 lb (64.411 kg)  Height 09/30/15 0812 5\' 2"  (1.575 m)     Head Cir --      Peak Flow --      Pain Score 09/30/15 0810 2     Pain Loc --      Pain Edu? --      Excl. in [place]? --     Constitutional: Alert and oriented. Well appearing and in no acute distress. Eyes: Conjunctivae are normal. PERRL. EOMI. Insect type bite noticed on the inner left eye and in the medial canthus. No erythema redness or drainage to the eye itself. Head: Atraumatic. Nose: No congestion/rhinnorhea. Mouth/Throat: Mucous membranes are moist.  Oropharynx non-erythematous. Neck: No stridor.  Full range of motion nontender Skin:  Skin is warm, dry and intact. No rash noted. Psychiatric: Mood and affect are normal. Speech and behavior are normal.  ____________________________________________   LABS (all labs ordered are listed, but only abnormal results are displayed)  Labs Reviewed - No data to  display ____________________________________________  EKG   ____________________________________________  RADIOLOGY   ____________________________________________   PROCEDURES  Procedure(s) performed: None  Critical Care performed: No  ____________________________________________   INITIAL IMPRESSION / ASSESSMENT AND PLAN / ED COURSE  Pertinent labs & imaging results that were available during my care of the patient were reviewed by me and considered in my medical decision making (see chart for details).  Insect bite with secondary skin infection around the eye. Patient started on Bactrim DS twice a day and given ibuprofen as needed for pain. She is to follow up with PCP or return to the ER with any worsening symptomology. ____________________________________________   FINAL CLINICAL IMPRESSION(S) / ED DIAGNOSES  Final diagnoses:  Insect bite  Eye infection, left     This chart was dictated using voice recognition software/Dragon. Despite best efforts to proofread, errors can occur which can change the meaning. Any change was purely unintentional.   Arlyss Repress, PA-C 09/30/15 [place], MD 09/30/15 [id number]

## 2015-09-30 NOTE — Discharge Instructions (Signed)
DEET Insect Repellent  DEET is a commonly used insect repellent. DEET is effective against mosquitoes, ticks, and chiggers.DEET is not effective against stinging insects, such as bees and wasps. When mosquitoes or ticks are active, take the following precautions.  Use DEET according to the directions on the label.  Wear protective clothing if you are outside in an area where there are weeds, tall grass, or bushes. This includes long pants, socks, and loose-fitting, long-sleeved shirts. Consider spraying DEET on your clothing. Avoid being outdoors in the early evening. This is when mosquitoes are most active.  Products with a low concentration of DEET (10% to 20%) may be useful in areas with few insects. Higher concentrations of DEET may be needed in areas with many insects. Repellents used on children should not contain more than 30% DEET. Although higher concentrations of DEET (up to 95%) are available for adults, they are not recommended for routine use. Concentrations higher than 50% do not provide additional protection. Depending on the concentration of DEET in a product, it can be effective for about 2 to 6 hours.  When applying DEET to children, use the lowest concentration that is effective. Ten percent DEET will last approximately 2 to 3 hours, while 30% will last 4 to 5 hours. Do not use DEET on infants younger than 2 months old. Do not apply DEET more often than once a day to children under the age of 2.  Avoid prolonged or excessive use of DEET. Use it sparingly to cover exposed skin and clothing. Adverse reactions to DEET in the recommended concentrations are uncommon. However, skin irritation can occur in some people.  Wash all treated skin and clothing with soap and water after returning indoors.  Do not allow children to apply insect repellent themselves.  Do not apply DEET near cuts or open wounds. You can apply DEET and sunscreen together. However, it is recommend that you apply  the sunscreen first.  Do not apply DEET to a child's hands or near a child's eyes and mouth. If DEET is accidentally sprayed in the eyes, wash the eyes out with large amounts of water.  Store DEET out of the reach of children.  Most authorities feel that it is safe to use DEET during pregnancy. However, pregnant women should only use insect repellents when they are in areas with a high risk of disease carried by insects (malaria, West Nile virus, encephalitis).   This information is not intended to replace advice given to you by your health care provider. Make sure you discuss any questions you have with your health care provider.   Document Released: 12/30/2000 Document Revised: 04/27/2014 Document Reviewed: 11/08/2014 Elsevier Interactive Patient Education [employer].

## 2015-09-30 NOTE — ED Notes (Signed)
States she noticed some pain and swelling to left eye since yesterday then last pm noticed some blurring vision

## 2016-02-17 ENCOUNTER — Emergency Department: Payer: Self-pay

## 2016-02-17 ENCOUNTER — Encounter: Payer: Self-pay | Admitting: Emergency Medicine

## 2016-02-17 DIAGNOSIS — Z5321 Procedure and treatment not carried out due to patient leaving prior to being seen by health care provider: Secondary | ICD-10-CM | POA: Insufficient documentation

## 2016-02-17 DIAGNOSIS — J45909 Unspecified asthma, uncomplicated: Secondary | ICD-10-CM | POA: Insufficient documentation

## 2016-02-17 DIAGNOSIS — Z791 Long term (current) use of non-steroidal anti-inflammatories (NSAID): Secondary | ICD-10-CM | POA: Insufficient documentation

## 2016-02-17 DIAGNOSIS — Z79899 Other long term (current) drug therapy: Secondary | ICD-10-CM | POA: Insufficient documentation

## 2016-02-17 DIAGNOSIS — R0789 Other chest pain: Secondary | ICD-10-CM | POA: Insufficient documentation

## 2016-02-17 DIAGNOSIS — Z87891 Personal history of nicotine dependence: Secondary | ICD-10-CM | POA: Insufficient documentation

## 2016-02-17 LAB — BASIC METABOLIC PANEL
Anion gap: 8 (ref 5–15)
BUN: 13 mg/dL (ref 6–20)
CO2: 24 mmol/L (ref 22–32)
Calcium: 8.6 mg/dL — ABNORMAL LOW (ref 8.9–10.3)
Chloride: 107 mmol/L (ref 101–111)
Creatinine, Ser: 0.85 mg/dL (ref 0.44–1.00)
GFR calc Af Amer: >60 mL/min (ref >60)
GLUCOSE: 138 mg/dL — AB (ref 65–99)
POTASSIUM: 3.5 mmol/L (ref 3.5–5.1)
Sodium: 139 mmol/L (ref 135–145)

## 2016-02-17 LAB — CBC
HEMATOCRIT: 36.2 % (ref 35.0–47.0)
Hemoglobin: 12.3 g/dL (ref 12.0–16.0)
MCH: 29 pg (ref 26.0–34.0)
MCHC: 33.9 g/dL (ref 32.0–36.0)
MCV: 85.4 fL (ref 80.0–100.0)
Platelets: 408 10*3/uL (ref 150–440)
RBC: 4.24 MIL/uL (ref 3.80–5.20)
RDW: 15.2 % — AB (ref 11.5–14.5)
WBC: 9.5 10*3/uL (ref 3.6–11.0)

## 2016-02-17 LAB — TROPONIN I: Troponin I: <0.03 ng/mL (ref <0.03)

## 2016-02-17 NOTE — ED Triage Notes (Signed)
Pt presents to ED with c/o chest discomfort and irregular heart beat since last night. Pt reports last night while sitting in bed states had intermittent feeling of  "I felt my heart stop and then pounding." Pt states "I have had this feeling all my life but it just never happened for so long or often." Pt states does not see a cardiologist or PCP for prior symptoms. (+) lightheadedness at times. Pt alert and oriented x 4, respirations even and unlabored, skin warm and dry.

## 2016-02-18 ENCOUNTER — Emergency Department
Admission: EM | Admit: 2016-02-18 | Discharge: 2016-02-18 | Disposition: A | Discharge status: Home / Self Care | Payer: Self-pay | Attending: Emergency Medicine | Admitting: Emergency Medicine

## 2016-11-28 ENCOUNTER — Emergency Department: Payer: Self-pay

## 2016-11-28 ENCOUNTER — Encounter: Payer: Self-pay | Admitting: Emergency Medicine

## 2016-11-28 ENCOUNTER — Emergency Department
Admission: EM | Admit: 2016-11-28 | Discharge: 2016-11-28 | Disposition: A | Discharge status: Home / Self Care | Payer: Self-pay | Attending: Student in an Organized Health Care Education/Training Program | Admitting: Student in an Organized Health Care Education/Training Program

## 2016-11-28 DIAGNOSIS — J069 Acute upper respiratory infection, unspecified: Secondary | ICD-10-CM | POA: Insufficient documentation

## 2016-11-28 DIAGNOSIS — R093 Abnormal sputum: Secondary | ICD-10-CM | POA: Insufficient documentation

## 2016-11-28 DIAGNOSIS — J029 Acute pharyngitis, unspecified: Secondary | ICD-10-CM | POA: Insufficient documentation

## 2016-11-28 DIAGNOSIS — Z87891 Personal history of nicotine dependence: Secondary | ICD-10-CM | POA: Insufficient documentation

## 2016-11-28 DIAGNOSIS — J45909 Unspecified asthma, uncomplicated: Secondary | ICD-10-CM | POA: Insufficient documentation

## 2016-11-28 MED ORDER — GUAIFENESIN-CODEINE 100-10 MG/5ML PO SYRP: 5.0000 mL | ORAL_SOLUTION | Freq: Three times a day (TID) | ORAL | 0 refills | Status: DC | PRN

## 2016-11-28 MED ORDER — ALBUTEROL SULFATE HFA 108 (90 BASE) MCG/ACT IN AERS: 2.0000 | INHALATION_SPRAY | RESPIRATORY_TRACT | 1 refills | Status: AC | PRN

## 2016-11-28 NOTE — ED Triage Notes (Signed)
Pt reports cough and sore throat for five days. Pt ambulatory to triage, no apparent distress noted.

## 2016-11-28 NOTE — ED Provider Notes (Signed)
[facility] Emergency Department Provider Note  ____________________________________________  Time seen: Approximately 9:23 AM  I have reviewed the triage vital signs and the nursing notes.   HISTORY  Chief Complaint Cough and Sore Throat   HPI Laura Petersen is a 31 y.o. female who presents to the emergency department for evaluation of sore throat and cough for about 5 days. No known fever. Cough is productive of green sputum. She is a smoker with a history of asthma. No relief with OTC cold medications.   Past Medical History:  Diagnosis Date  . Allergy   . Anxiety   . Asthma   . Depression   . GERD (gastroesophageal reflux disease)   . Headache   . Kidney stones     Patient Active Problem List   Diagnosis Date Noted  . Calculus of gallbladder with other cholecystitis, without mention of obstruction 04/11/2013    Past Surgical History:  Procedure Laterality Date  . CESAREAN SECTION    . CHOLECYSTECTOMY    . [place] EXTRACTION  2014    Prior to Admission medications   Medication Sig Start Date End Date Taking? Authorizing Provider  albuterol (PROVENTIL HFA;VENTOLIN HFA) 108 (90 Base) MCG/ACT inhaler Inhale 2 puffs into the lungs every 4 (four) hours as needed for wheezing or shortness of breath. 11/28/16   Zayonna Ayuso, Johnette Abraham B, FNP  clonazePAM (KLONOPIN) 0.5 MG tablet Take 0.5 mg by mouth 2 (two) times daily as needed for anxiety.    [provider]  clonazePAM (KLONOPIN) 0.5 MG tablet Take 1 tablet (0.5 mg total) by mouth 2 (two) times daily. 07/29/15 07/28/16  Earleen Newport, MD  DULoxetine (CYMBALTA) 30 MG capsule Take 30 mg by mouth daily.    [provider]  guaiFENesin-codeine (ROBITUSSIN AC) 100-10 MG/5ML syrup Take 5 mLs by mouth 3 (three) times daily as needed for cough. 11/28/16   Klint Lezcano, Johnette Abraham B, FNP  ibuprofen (ADVIL,MOTRIN) 800 MG tablet Take 1 tablet (800 mg total) by mouth every 8 (eight) hours as needed.  09/30/15   Beers, Pierce Crane, PA-C  sulfamethoxazole-trimethoprim (BACTRIM DS,SEPTRA DS) 800-160 MG tablet Take 1 tablet by mouth 2 (two) times daily. 09/30/15   Beers, Pierce Crane, PA-C    Allergies Patient has no known allergies.  Family History  Problem Relation Age of Onset  . Breast cancer Maternal Aunt   . Breast cancer Paternal Aunt   . Cervical cancer Mother     Social History Social History  Substance Use Topics  . Smoking status: Former Smoker    Packs/day: 1.00    Years: 6.00  . Smokeless tobacco: Never Used  . Alcohol use Yes    Review of Systems Constitutional: Negative for fever/chills ENT: Positive for sore throat. Cardiovascular: Denies chest pain. Respiratory: Positive for shortness of breath. Positive for cough. Gastrointestinal: Negative for nausea,  no vomiting.  No diarrhea.  Musculoskeletal: Negative for body aches Skin: Negative for rash. Neurological: Negative for headaches ____________________________________________   PHYSICAL EXAM:  VITAL SIGNS: ED Triage Vitals  Enc Vitals Group     BP 11/28/16 0911 118/85     Pulse Rate 11/28/16 0911 83     Resp 11/28/16 0911 18     Temp 11/28/16 0911 98.3 F (36.8 C)     Temp Source 11/28/16 0911 Oral     SpO2 11/28/16 0911 98 %     Weight 11/28/16 0913 152 lb (68.9 kg)     Height 11/28/16 0913 5\' 1"  (  1.549 m)     Head Circumference --      Peak Flow --      Pain Score 11/28/16 0911 5     Pain Loc --      Pain Edu? --      Excl. in [place]? --     Constitutional: Alert and oriented. Well appearing and in no acute distress. Eyes: Conjunctivae are normal. EOMI. Ears: Bilateral TM normal Nose: No congestion noted; no rhinnorhea. Mouth/Throat: Mucous membranes are moist.  Oropharynx mildly erythematous. Tonsils normal. Neck: No stridor.  Lymphatic: No cervical lymphadenopathy. Cardiovascular: Normal rate, regular rhythm. Good peripheral circulation. Respiratory: Normal respiratory effort.  No  retractions. Breath sounds clear throughout. Gastrointestinal: Soft and nontender.  Musculoskeletal: FROM x 4 extremities.  Neurologic:  Normal speech and language.  Skin:  Skin is warm, dry and intact. No rash noted. Psychiatric: Mood and affect are normal. Speech and behavior are normal.  ____________________________________________   LABS (all labs ordered are listed, but only abnormal results are displayed)  Labs Reviewed - No data to display ____________________________________________  EKG  ____________________________________________  RADIOLOGY  Chest x-ray negative for cardiopulmonary abnormality per radiology. ____________________________________________   PROCEDURES  Procedure(s) performed: None  Critical Care performed: No ____________________________________________   INITIAL IMPRESSION / ASSESSMENT AND PLAN / ED COURSE  31 year old female who presents to the ER for evaluation of cough. Chest x-ray is negative. She will be given a prescription for Robitussin [facility] and advised to follow up with her primary care provider for symptoms that are not improving over the next few days. She is to return to the ER for symptoms that change or worsen.  Pertinent labs & imaging results that were available during my care of the patient were reviewed by me and considered in my medical decision making (see chart for details).  Discharge Medication List as of 11/28/2016 10:29 AM    START taking these medications   Details  guaiFENesin-codeine (ROBITUSSIN AC) 100-10 MG/5ML syrup Take 5 mLs by mouth 3 (three) times daily as needed for cough., Starting Sat 11/28/2016, Print        If controlled substance prescribed during this visit, 12 month history viewed on the [place] prior to issuing an initial prescription for Schedule II or III opiod. ____________________________________________   FINAL CLINICAL IMPRESSION(S) / ED DIAGNOSES  Final diagnoses:  Upper respiratory tract  infection, unspecified type    Note:  This document was prepared using Dragon voice recognition software and may include unintentional dictation errors.     Victorino Dike, FNP 11/28/16 1102    Merlyn Lot, MD 11/28/16 1301

## 2016-12-24 ENCOUNTER — Encounter: Payer: Self-pay | Admitting: Emergency Medicine

## 2016-12-24 ENCOUNTER — Emergency Department
Admission: EM | Admit: 2016-12-24 | Discharge: 2016-12-25 | Disposition: A | Discharge status: Home / Self Care | Payer: Self-pay | Attending: Emergency Medicine | Admitting: Emergency Medicine

## 2016-12-24 DIAGNOSIS — Z79899 Other long term (current) drug therapy: Secondary | ICD-10-CM | POA: Insufficient documentation

## 2016-12-24 DIAGNOSIS — J45909 Unspecified asthma, uncomplicated: Secondary | ICD-10-CM | POA: Insufficient documentation

## 2016-12-24 DIAGNOSIS — F17210 Nicotine dependence, cigarettes, uncomplicated: Secondary | ICD-10-CM | POA: Insufficient documentation

## 2016-12-24 DIAGNOSIS — N3000 Acute cystitis without hematuria: Secondary | ICD-10-CM | POA: Insufficient documentation

## 2016-12-24 DIAGNOSIS — R102 Pelvic and perineal pain: Secondary | ICD-10-CM | POA: Insufficient documentation

## 2016-12-24 LAB — CBC
HCT: 35.4 % (ref 35.0–47.0)
Hemoglobin: 12 g/dL (ref 12.0–16.0)
MCH: 28.4 pg (ref 26.0–34.0)
MCHC: 33.9 g/dL (ref 32.0–36.0)
MCV: 83.9 fL (ref 80.0–100.0)
Platelets: 396 10*3/uL (ref 150–440)
RBC: 4.21 MIL/uL (ref 3.80–5.20)
RDW: 15.3 % — ABNORMAL HIGH (ref 11.5–14.5)
WBC: 12.1 10*3/uL — ABNORMAL HIGH (ref 3.6–11.0)

## 2016-12-24 LAB — URINALYSIS, COMPLETE (UACMP) WITH MICROSCOPIC
BILIRUBIN URINE: NEGATIVE
Glucose, UA: NEGATIVE mg/dL
KETONES UR: NEGATIVE mg/dL
Nitrite: POSITIVE — AB
Protein, ur: 100 mg/dL — AB
SPECIFIC GRAVITY, URINE: 1.019 (ref 1.005–1.030)
pH: 7 (ref 5.0–8.0)

## 2016-12-24 LAB — COMPREHENSIVE METABOLIC PANEL
ALT: 19 U/L (ref 14–54)
ANION GAP: 6 (ref 5–15)
AST: 16 U/L (ref 15–41)
Albumin: 4 g/dL (ref 3.5–5.0)
Alkaline Phosphatase: 53 U/L (ref 38–126)
BILIRUBIN TOTAL: 0.3 mg/dL (ref 0.3–1.2)
BUN: 11 mg/dL (ref 6–20)
CHLORIDE: 108 mmol/L (ref 101–111)
CO2: 28 mmol/L (ref 22–32)
Calcium: 9.3 mg/dL (ref 8.9–10.3)
Creatinine, Ser: 0.83 mg/dL (ref 0.44–1.00)
Glucose, Bld: 104 mg/dL — ABNORMAL HIGH (ref 65–99)
POTASSIUM: 4.1 mmol/L (ref 3.5–5.1)
Sodium: 142 mmol/L (ref 135–145)
TOTAL PROTEIN: 7 g/dL (ref 6.5–8.1)

## 2016-12-24 NOTE — ED Triage Notes (Signed)
Patient with complaint of lower abdominal, back pain and urinary frequency that started 2-3 days ago.

## 2016-12-24 NOTE — ED Notes (Signed)
ED Provider at bedside. 

## 2016-12-25 MED ORDER — CIPROFLOXACIN HCL 500 MG PO TABS: 500.0000 mg | ORAL_TABLET | Freq: Two times a day (BID) | ORAL | 0 refills | Status: AC

## 2016-12-25 MED ORDER — CIPROFLOXACIN HCL 500 MG PO TABS
500.0000 mg | ORAL_TABLET | Freq: Once | ORAL | Status: AC
  Administered 2016-12-25: 500 mg via ORAL
  Filled 2016-12-25: qty 1

## 2016-12-25 NOTE — ED Provider Notes (Signed)
[facility] Emergency Department Provider Note    First MD Initiated Contact with Patient 12/24/16 2345     (approximate)  I have reviewed the triage vital signs and the nursing notes.   HISTORY  Chief Complaint Abdominal Pain; Back Pain; and Urinary Frequency   HPI Laura Petersen is a 31 y.o. female presents with urinary urgency and frequency as well as pelvic discomfort 3 days. Patient denies any fever no vomiting. Patient denies any constipation. Patient denies any hematuria or dysuria.          Past Medical History:  Diagnosis Date  . Allergy   . Anxiety   . Asthma   . Depression   . GERD (gastroesophageal reflux disease)   . Headache   . Kidney stones     Patient Active Problem List   Diagnosis Date Noted  . Calculus of gallbladder with other cholecystitis, without mention of obstruction 04/11/2013    Past Surgical History:  Procedure Laterality Date  . CESAREAN SECTION    . CHOLECYSTECTOMY    . [place] EXTRACTION  2014    Prior to Admission medications   Medication Sig Start Date End Date Taking? Authorizing Provider  albuterol (PROVENTIL HFA;VENTOLIN HFA) 108 (90 Base) MCG/ACT inhaler Inhale 2 puffs into the lungs every 4 (four) hours as needed for wheezing or shortness of breath. 11/28/16   Triplett, Johnette Abraham B, FNP  clonazePAM (KLONOPIN) 0.5 MG tablet Take 0.5 mg by mouth 2 (two) times daily as needed for anxiety.    [provider]  clonazePAM (KLONOPIN) 0.5 MG tablet Take 1 tablet (0.5 mg total) by mouth 2 (two) times daily. 07/29/15 07/28/16  Earleen Newport, MD  DULoxetine (CYMBALTA) 30 MG capsule Take 30 mg by mouth daily.    [provider]  guaiFENesin-codeine (ROBITUSSIN AC) 100-10 MG/5ML syrup Take 5 mLs by mouth 3 (three) times daily as needed for cough. 11/28/16   Triplett, Johnette Abraham B, FNP  ibuprofen (ADVIL,MOTRIN) 800 MG tablet Take 1 tablet (800 mg total) by mouth every 8 (eight) hours as needed.  09/30/15   Beers, Pierce Crane, PA-C  sulfamethoxazole-trimethoprim (BACTRIM DS,SEPTRA DS) 800-160 MG tablet Take 1 tablet by mouth 2 (two) times daily. 09/30/15   Beers, Pierce Crane, PA-C    Allergies No known drug allergies  Family History  Problem Relation Age of Onset  . Breast cancer Maternal Aunt   . Breast cancer Paternal Aunt   . Cervical cancer Mother     Social History Social History  Substance Use Topics  . Smoking status: Current Every Day Smoker    Packs/day: 1.00    Years: 6.00  . Smokeless tobacco: Never Used  . Alcohol use No    Review of Systems Constitutional: No fever/chills Eyes: No visual changes. ENT: No sore throat. Cardiovascular: Denies chest pain. Respiratory: Denies shortness of breath. Gastrointestinal: No abdominal pain.  No nausea, no vomiting.  No diarrhea.  No constipation. Genitourinary: Negative for dysuria. Musculoskeletal: Negative for neck pain.  Negative for back pain. Integumentary: Negative for rash. Neurological: Negative for headaches, focal weakness or numbness.   ____________________________________________   PHYSICAL EXAM:  VITAL SIGNS: ED Triage Vitals [12/24/16 2121]  Enc Vitals Group     BP 129/81     Pulse Rate 88     Resp 18     Temp 98.7 F (37.1 C)     Temp Source Oral     SpO2 98 %     Weight  68.9 kg (152 lb)     Height 1.549 m (5\' 1" )     Head Circumference      Peak Flow      Pain Score 6     Pain Loc      Pain Edu?      Excl. in [place]?     Constitutional: Alert and oriented. Well appearing and in no acute distress. Eyes: Conjunctivae are normal.  Head: Atraumatic. Mouth/Throat: Mucous membranes are moist. Oropharynx non-erythematous. Neck: No stridor.   Cardiovascular: Normal rate, regular rhythm. Good peripheral circulation. Grossly normal heart sounds. Respiratory: Normal respiratory effort.  No retractions. Lungs CTAB. Gastrointestinal: Soft and nontender. No distention.  Musculoskeletal: No lower  extremity tenderness nor edema. No gross deformities of extremities. Neurologic:  Normal speech and language. No gross focal neurologic deficits are appreciated.  Skin:  Skin is warm, dry and intact. No rash noted. Psychiatric: Mood and affect are normal. Speech and behavior are normal.  ____________________________________________   LABS (all labs ordered are listed, but only abnormal results are displayed)  Labs Reviewed  COMPREHENSIVE METABOLIC PANEL - Abnormal; Notable for the following:       Result Value   Glucose, Bld 104 (*)    All other components within normal limits  CBC - Abnormal; Notable for the following:    WBC 12.1 (*)    RDW 15.3 (*)    All other components within normal limits  URINALYSIS, COMPLETE (UACMP) WITH MICROSCOPIC - Abnormal; Notable for the following:    Color, Urine YELLOW (*)    APPearance CLOUDY (*)    Hgb urine dipstick SMALL (*)    Protein, ur 100 (*)    Nitrite POSITIVE (*)    Leukocytes, UA MODERATE (*)    Bacteria, UA RARE (*)    Squamous Epithelial / LPF 0-5 (*)    All other components within normal limits     Procedures   ____________________________________________   INITIAL IMPRESSION / ASSESSMENT AND PLAN / ED COURSE  Pertinent labs & imaging results that were available during my care of the patient were reviewed by me and considered in my medical decision making (see chart for details).  31 year old female presenting with history of physical exam consistent with urinary tract infection which was confirmed on urinalysis. Patient given Cipro in the emergency department will be prescribed same for home.      ____________________________________________  FINAL CLINICAL IMPRESSION(S) / ED DIAGNOSES  Final diagnoses:  None     MEDICATIONS GIVEN DURING THIS VISIT:  Medications  ciprofloxacin (CIPRO) tablet 500 mg (not administered)     NEW OUTPATIENT MEDICATIONS STARTED DURING THIS VISIT:  New Prescriptions   No  medications on file    Modified Medications   No medications on file    Discontinued Medications   No medications on file     Note:  This document was prepared using Dragon voice recognition software and may include unintentional dictation errors.    Gregor Hams, MD 12/25/16 [phone]

## 2016-12-27 LAB — URINE CULTURE

## 2017-01-30 ENCOUNTER — Emergency Department
Admission: EM | Admit: 2017-01-30 | Discharge: 2017-01-30 | Disposition: A | Discharge status: Home / Self Care | Payer: Self-pay | Attending: Emergency Medicine | Admitting: Emergency Medicine

## 2017-01-30 ENCOUNTER — Encounter: Payer: Self-pay | Admitting: Emergency Medicine

## 2017-01-30 DIAGNOSIS — J45909 Unspecified asthma, uncomplicated: Secondary | ICD-10-CM | POA: Insufficient documentation

## 2017-01-30 DIAGNOSIS — F17210 Nicotine dependence, cigarettes, uncomplicated: Secondary | ICD-10-CM | POA: Insufficient documentation

## 2017-01-30 DIAGNOSIS — Z79899 Other long term (current) drug therapy: Secondary | ICD-10-CM | POA: Insufficient documentation

## 2017-01-30 DIAGNOSIS — N12 Tubulo-interstitial nephritis, not specified as acute or chronic: Secondary | ICD-10-CM | POA: Insufficient documentation

## 2017-01-30 LAB — CBC
HEMATOCRIT: 35.1 % (ref 35.0–47.0)
HEMOGLOBIN: 12 g/dL (ref 12.0–16.0)
MCH: 28.5 pg (ref 26.0–34.0)
MCHC: 34.2 g/dL (ref 32.0–36.0)
MCV: 83.3 fL (ref 80.0–100.0)
PLATELETS: 348 10*3/uL (ref 150–440)
RBC: 4.21 MIL/uL (ref 3.80–5.20)
RDW: 16.1 % — ABNORMAL HIGH (ref 11.5–14.5)
WBC: 10.5 10*3/uL (ref 3.6–11.0)

## 2017-01-30 LAB — URINALYSIS, COMPLETE (UACMP) WITH MICROSCOPIC
Bilirubin Urine: NEGATIVE
GLUCOSE, UA: NEGATIVE mg/dL
Ketones, ur: NEGATIVE mg/dL
Nitrite: NEGATIVE
PROTEIN: 30 mg/dL — AB
SPECIFIC GRAVITY, URINE: 1.014 (ref 1.005–1.030)
pH: 8 (ref 5.0–8.0)

## 2017-01-30 LAB — BASIC METABOLIC PANEL
ANION GAP: 9 (ref 5–15)
BUN: 10 mg/dL (ref 6–20)
CO2: 23 mmol/L (ref 22–32)
Calcium: 8.9 mg/dL (ref 8.9–10.3)
Chloride: 107 mmol/L (ref 101–111)
Creatinine, Ser: 0.81 mg/dL (ref 0.44–1.00)
GFR calc Af Amer: >60 mL/min (ref >60)
GLUCOSE: 108 mg/dL — AB (ref 65–99)
POTASSIUM: 3.7 mmol/L (ref 3.5–5.1)
Sodium: 139 mmol/L (ref 135–145)

## 2017-01-30 LAB — LACTIC ACID, PLASMA: Lactic Acid, Venous: 0.6 mmol/L (ref 0.5–1.9)

## 2017-01-30 MED ORDER — OXYCODONE-ACETAMINOPHEN 5-325 MG PO TABS
1.0000 | ORAL_TABLET | Freq: Once | ORAL | Status: AC
  Administered 2017-01-30: 1 via ORAL
  Filled 2017-01-30: qty 1

## 2017-01-30 MED ORDER — OXYCODONE-ACETAMINOPHEN 5-325 MG PO TABS: 1.0000 | ORAL_TABLET | ORAL | 0 refills | Status: DC | PRN

## 2017-01-30 MED ORDER — CIPROFLOXACIN HCL 500 MG PO TABS
500.0000 mg | ORAL_TABLET | Freq: Once | ORAL | Status: AC
  Administered 2017-01-30: 500 mg via ORAL
  Filled 2017-01-30: qty 1

## 2017-01-30 MED ORDER — ONDANSETRON 4 MG PO TBDP
4.0000 mg | ORAL_TABLET | Freq: Once | ORAL | Status: AC
  Administered 2017-01-30: 4 mg via ORAL
  Filled 2017-01-30: qty 1

## 2017-01-30 MED ORDER — CIPROFLOXACIN HCL 500 MG PO TABS: 500.0000 mg | ORAL_TABLET | Freq: Two times a day (BID) | ORAL | 0 refills | Status: AC

## 2017-01-30 NOTE — ED Triage Notes (Signed)
Pt states that she was here about a month ago with UTI and is having similar symptoms at this time. Pt reports that she completed her antibiotic at the time and has felt an increase in symptoms in the last two weeks. Pt is ambulatory to triage at this time.

## 2017-01-30 NOTE — ED Notes (Signed)
ED Provider at bedside. 

## 2017-01-30 NOTE — ED Provider Notes (Addendum)
[facility] Emergency Department Provider Note    First MD Initiated Contact with Patient 01/30/17 [phone]     (approximate)  I have reviewed the triage vital signs and the nursing notes.   HISTORY  Chief Complaint Recurrent UTI    HPI Laura Petersen is a 31 y.o. female with local list of chronic medical conditions including urinary tract infection (culture revealing Escherichia coli) one month ago presents to the emergency department with dysuria urinary frequency and urgency for the past 2 weeks accompanied by fever. Patient admits to nausea however no vomiting.   Past Medical History:  Diagnosis Date  . Allergy   . Anxiety   . Asthma   . Depression   . GERD (gastroesophageal reflux disease)   . Headache   . Kidney stones     Patient Active Problem List   Diagnosis Date Noted  . Calculus of gallbladder with other cholecystitis, without mention of obstruction 04/11/2013    Past Surgical History:  Procedure Laterality Date  . CESAREAN SECTION    . CHOLECYSTECTOMY    . [place] EXTRACTION  2014    Prior to Admission medications   Medication Sig Start Date End Date Taking? Authorizing Provider  albuterol (PROVENTIL HFA;VENTOLIN HFA) 108 (90 Base) MCG/ACT inhaler Inhale 2 puffs into the lungs every 4 (four) hours as needed for wheezing or shortness of breath. 11/28/16   Triplett, Johnette Abraham B, FNP  clonazePAM (KLONOPIN) 0.5 MG tablet Take 0.5 mg by mouth 2 (two) times daily as needed for anxiety.    [provider]  clonazePAM (KLONOPIN) 0.5 MG tablet Take 1 tablet (0.5 mg total) by mouth 2 (two) times daily. 07/29/15 07/28/16  Earleen Newport, MD  DULoxetine (CYMBALTA) 30 MG capsule Take 30 mg by mouth daily.    [provider]  guaiFENesin-codeine (ROBITUSSIN AC) 100-10 MG/5ML syrup Take 5 mLs by mouth 3 (three) times daily as needed for cough. 11/28/16   Triplett, Johnette Abraham B, FNP  ibuprofen (ADVIL,MOTRIN) 800 MG tablet Take 1  tablet (800 mg total) by mouth every 8 (eight) hours as needed. 09/30/15   Beers, Pierce Crane, PA-C  sulfamethoxazole-trimethoprim (BACTRIM DS,SEPTRA DS) 800-160 MG tablet Take 1 tablet by mouth 2 (two) times daily. 09/30/15   Beers, Pierce Crane, PA-C    Allergies Patient has no known allergies.  Family History  Problem Relation Age of Onset  . Breast cancer Maternal Aunt   . Breast cancer Paternal Aunt   . Cervical cancer Mother     Social History Social History  Substance Use Topics  . Smoking status: Current Every Day Smoker    Packs/day: 1.00    Years: 6.00  . Smokeless tobacco: Never Used  . Alcohol use No    Review of Systems Constitutional: Positive for fever/chills Eyes: No visual changes. ENT: No sore throat. Cardiovascular: Denies chest pain. Respiratory: Denies shortness of breath. Gastrointestinal: No abdominal pain.  No nausea, no vomiting.  No diarrhea.  No constipation. Genitourinary:Positive for dysuria urinary frequency and urgency. Musculoskeletal: Negative for neck pain.  Negative for back pain. Integumentary: Negative for rash. Neurological: Negative for headaches, focal weakness or numbness.  ____________________________________________   PHYSICAL EXAM:  VITAL SIGNS: ED Triage Vitals  Enc Vitals Group     BP 01/30/17 0056 120/79     Pulse Rate 01/30/17 0056 (!) 114     Resp 01/30/17 0056 18     Temp 01/30/17 0056 (!) 100.6 F (38.1 C)  Temp Source 01/30/17 0056 Oral     SpO2 01/30/17 0056 99 %     Weight 01/30/17 0053 68.9 kg (152 lb)     Height 01/30/17 0053 1.524 m (5')     Head Circumference --      Peak Flow --      Pain Score 01/30/17 0053 10     Pain Loc --      Pain Edu? --      Excl. in [place]? --     Constitutional: Alert and oriented. Well appearing and in no acute distress. Eyes: Conjunctivae are normal.  Head: Atraumatic. Mouth/Throat: Mucous membranes are moist.  Oropharynx non-erythematous. Neck: No stridor. Cardiovascular:  Normal rate, regular rhythm. Good peripheral circulation. Grossly normal heart sounds. Respiratory: Normal respiratory effort.  No retractions. Lungs CTAB. Gastrointestinal: Soft and nontender. No distention. Positive for left CVA tenderness  Musculoskeletal: No lower extremity tenderness nor edema. No gross deformities of extremities. Neurologic:  Normal speech and language. No gross focal neurologic deficits are appreciated.  Skin:  Skin is warm, dry and intact. No rash noted. Psychiatric: Mood and affect are normal. Speech and behavior are normal.  ____________________________________________   LABS (all labs ordered are listed, but only abnormal results are displayed)  Labs Reviewed  URINALYSIS, COMPLETE (UACMP) WITH MICROSCOPIC - Abnormal; Notable for the following:       Result Value   Color, Urine YELLOW (*)    APPearance HAZY (*)    Hgb urine dipstick SMALL (*)    Protein, ur 30 (*)    Leukocytes, UA LARGE (*)    Bacteria, UA MANY (*)    Squamous Epithelial / LPF 0-5 (*)    All other components within normal limits  BASIC METABOLIC PANEL - Abnormal; Notable for the following:    Glucose, Bld 108 (*)    All other components within normal limits  CBC - Abnormal; Notable for the following:    RDW 16.1 (*)    All other components within normal limits  CULTURE, BLOOD (ROUTINE X 2)  CULTURE, BLOOD (ROUTINE X 2)  LACTIC ACID, PLASMA  LACTIC ACID, PLASMA  POC URINE PREG, ED    Procedures   ____________________________________________   INITIAL IMPRESSION / ASSESSMENT AND PLAN / ED COURSE  As part of my medical decision making, I reviewed the following data within the electronic MEDICAL RECORD NUMBER 31 year old female presented with history of physical exam consistent with pyelonephritis. Laboratory data revealed urinalysis consistent with urinary tract infection however no leukocytosis or blood cell count of 10.5. I reviewed the patient's urine culture from 12/24/2016 which  revealed Escherichia coli that was sensitive to Cipro and a such Cipro as prescribed with culture pending at this time. Spoke with the patient and her mother at length regarding all clinical findings. Informed of warning signs and will return to the emergency department     ____________________________________________  FINAL CLINICAL IMPRESSION(S) / ED DIAGNOSES  Final diagnoses:  Pyelonephritis     MEDICATIONS GIVEN DURING THIS VISIT:  Medications - No data to display   NEW OUTPATIENT MEDICATIONS STARTED DURING THIS VISIT:  New Prescriptions   No medications on file    Modified Medications   No medications on file    Discontinued Medications   No medications on file     Note:  This document was prepared using Dragon voice recognition software and may include unintentional dictation errors.    Gregor Hams, MD 01/30/17 [place], [place],  MD 01/30/17 [id number]

## 2017-02-01 LAB — URINE CULTURE

## 2017-02-04 LAB — CULTURE, BLOOD (ROUTINE X 2): Culture: NO GROWTH

## 2018-04-07 ENCOUNTER — Encounter: Payer: Self-pay | Admitting: Emergency Medicine

## 2018-04-07 ENCOUNTER — Other Ambulatory Visit: Payer: Self-pay

## 2018-04-07 DIAGNOSIS — Z5321 Procedure and treatment not carried out due to patient leaving prior to being seen by health care provider: Secondary | ICD-10-CM | POA: Insufficient documentation

## 2018-04-07 DIAGNOSIS — R569 Unspecified convulsions: Secondary | ICD-10-CM | POA: Insufficient documentation

## 2018-04-07 DIAGNOSIS — F410 Panic disorder [episodic paroxysmal anxiety] without agoraphobia: Secondary | ICD-10-CM | POA: Insufficient documentation

## 2018-04-07 LAB — POC URINE PREG, ED: PREG TEST UR: NEGATIVE

## 2018-04-07 LAB — BASIC METABOLIC PANEL
ANION GAP: 6 (ref 5–15)
BUN: 14 mg/dL (ref 6–20)
CO2: 22 mmol/L (ref 22–32)
Calcium: 8.3 mg/dL — ABNORMAL LOW (ref 8.9–10.3)
Chloride: 107 mmol/L (ref 98–111)
Creatinine, Ser: 0.69 mg/dL (ref 0.44–1.00)
GFR calc Af Amer: >60 mL/min (ref >60)
GLUCOSE: 106 mg/dL — AB (ref 70–99)
Potassium: 3.9 mmol/L (ref 3.5–5.1)
Sodium: 135 mmol/L (ref 135–145)

## 2018-04-07 LAB — CBC
HEMATOCRIT: 33.9 % — AB (ref 36.0–46.0)
HEMOGLOBIN: 10.8 g/dL — AB (ref 12.0–15.0)
MCH: 27.6 pg (ref 26.0–34.0)
MCHC: 31.9 g/dL (ref 30.0–36.0)
MCV: 86.5 fL (ref 80.0–100.0)
Platelets: 371 10*3/uL (ref 150–400)
RBC: 3.92 MIL/uL (ref 3.87–5.11)
RDW: 15.9 % — ABNORMAL HIGH (ref 11.5–15.5)
WBC: 8.2 10*3/uL (ref 4.0–10.5)
nRBC: 0 % (ref 0.0–0.2)

## 2018-04-07 NOTE — ED Triage Notes (Signed)
Pt to ED from home states started having a panic attack and hyperventilating at work and then woke up on the floor with people standing over her.  Unsure if she fell or hit head, states had urinated on self, states hx of panic attacks and seizures last one 2 years ago.  Pt presents A&Ox4, speaking in complete and coherent sentences, chest rise even and unlabored and in NAD at this time.

## 2018-04-08 ENCOUNTER — Emergency Department
Admission: EM | Admit: 2018-04-08 | Discharge: 2018-04-08 | Discharge status: Against Medical Advice | Payer: Self-pay | Attending: Emergency Medicine | Admitting: Emergency Medicine

## 2018-04-08 HISTORY — DX: Unspecified convulsions: R56.9

## 2018-04-08 NOTE — ED Notes (Signed)
No answer when called several times from lobby 

## 2018-04-10 ENCOUNTER — Telehealth: Payer: Self-pay | Admitting: Emergency Medicine

## 2018-04-10 NOTE — Telephone Encounter (Signed)
Called patient due to lwot to inquire about condition and follow up plans. Left message.   

## 2020-02-28 ENCOUNTER — Other Ambulatory Visit: Payer: Self-pay

## 2020-02-28 ENCOUNTER — Ambulatory Visit
Admission: RE | Admit: 2020-02-28 | Discharge: 2020-02-28 | Disposition: A | Discharge status: Home / Self Care | Payer: Self-pay | Source: Ambulatory Visit | Attending: Oncology | Admitting: Oncology

## 2020-02-28 ENCOUNTER — Ambulatory Visit: Payer: Self-pay | Attending: Oncology | Admitting: *Deleted

## 2020-02-28 ENCOUNTER — Encounter: Payer: Self-pay | Admitting: *Deleted

## 2020-02-28 VITALS — BP 112/57 | HR 93 | Temp 99.2°F | Ht 60.5 in | Wt 188.2 lb

## 2020-02-28 DIAGNOSIS — Z9189 Other specified personal risk factors, not elsewhere classified: Secondary | ICD-10-CM

## 2020-02-28 DIAGNOSIS — N6452 Nipple discharge: Secondary | ICD-10-CM

## 2020-02-28 NOTE — Patient Instructions (Signed)
Gave patient hand-out, Women Staying Healthy, Active and Well from [facility], with education on breast health, pap smears, heart and colon health. 

## 2020-02-28 NOTE — Progress Notes (Signed)
Subjective:     Patient ID: Laura Petersen, female   DOB: 23-Jan-1986, 34 y.o.   MRN: [id number]  HPI   [facility] Medical History Record - 02/28/20 [id number]      Breast History   Screening cycle New    CBE Date 02/28/15    Provider (CBE) [facility]    Initial Mammogram 02/28/20    Last Mammogram Never    Recent Breast Symptoms Nipple discharge   BL crusting & sm amt of discharge with manipulation     Breast Cancer History   Breast Cancer History No personal or family history    Comments/Details MGM - breast (60's), Mat Aunt 1 - Breast (50's), Mat Aunt 2 - Cervical (50's), Pat Aunt - Breast (50's),      Previous History of Breast Problems   Breast Surgery or Biopsy None    Breast Implants N/A    BSE Done Never      Gynecological/Obstetrical History   LMP 01/28/20    Is there any chance that the client could be pregnant?  No    Age at menarche 19    Age at menopause n/a    PAP smear history Annually    Date of last PAP  02/28/15    Provider (PAP) [facility]    Age at first live birth 39    Breast fed children Yes (type length in comments)   66yrs   DES Exposure No    Cervical, Uterine or Ovarian cancer No    Family history of Cervial, Uterine or Ovarian cancer Yes   as above   Hysterectomy No    Cervix removed No    Ovaries removed No    Laser/Cryosurgery No    Current method of birth control Other (see comments)   tubal ligation   Current method of Estrogen/Hormone replacement None    Smoking history Yes   quit 6 mos ago           Review of Systems     Objective:   Physical Exam Chest:     Breasts:        Right: Nipple discharge and tenderness present. No swelling, bleeding, inverted nipple, mass or skin change.        Left: Tenderness present. No swelling, bleeding, inverted nipple, mass, nipple discharge or skin change.     Comments: Bilateral nipple discharge on expression only.  - On todays exam the right nipple has a light green discharge on expression from 1  duct Lymphadenopathy:     Upper Body:     Right upper body: No supraclavicular or axillary adenopathy.     Left upper body: No supraclavicular or axillary adenopathy.        Assessment:     Patient presents to [facility] with complaints of bilateral nipple discharge.  States she also had a left breast mass that has since resolved.  States she had her Covid vaccine in the same arm that the mass was noted in her breast.  States she has had nipple "crusting" for about 1 year and a "light brown" discharge on expression only.  States the discharge is better since she quit smoking six months ago.  States when she removes the crusting she see the discharge.  See family history of cancer in above notation.  On clinical breast exam, bilateral breast are extremely tender to palpation.  The patient is. Tyrer-Cuzick breast cancer risk assessment with a lifetime risk of 21.6 %.  Per NCCN guidelines modified  imaging is recommended.  Genetic testing is not recommended.    Plan:     Will get bilateral diagnostic mammogram and ultrasound.  Discussed high risk breast clinic referral.    Will follow up per [facility] protocol.

## 2020-03-01 ENCOUNTER — Other Ambulatory Visit: Payer: Self-pay | Admitting: *Deleted

## 2020-03-01 DIAGNOSIS — N63 Unspecified lump in unspecified breast: Secondary | ICD-10-CM

## 2020-03-04 ENCOUNTER — Inpatient Hospital Stay: Payer: Self-pay

## 2020-03-04 ENCOUNTER — Inpatient Hospital Stay: Payer: Self-pay | Admitting: Oncology

## 2020-03-11 ENCOUNTER — Inpatient Hospital Stay: Payer: Self-pay | Attending: Oncology | Admitting: Oncology

## 2020-03-11 ENCOUNTER — Inpatient Hospital Stay: Payer: Self-pay

## 2020-03-11 ENCOUNTER — Encounter: Payer: Self-pay | Admitting: Oncology

## 2020-03-11 ENCOUNTER — Other Ambulatory Visit: Payer: Self-pay

## 2020-03-11 VITALS — BP 121/88 | HR 97 | Temp 97.1°F | Resp 16 | Wt 187.8 lb

## 2020-03-11 DIAGNOSIS — Z803 Family history of malignant neoplasm of breast: Secondary | ICD-10-CM

## 2020-03-11 DIAGNOSIS — Z836 Family history of other diseases of the respiratory system: Secondary | ICD-10-CM

## 2020-03-11 DIAGNOSIS — Z8049 Family history of malignant neoplasm of other genital organs: Secondary | ICD-10-CM

## 2020-03-11 DIAGNOSIS — N63 Unspecified lump in unspecified breast: Secondary | ICD-10-CM

## 2020-03-11 DIAGNOSIS — Z806 Family history of leukemia: Secondary | ICD-10-CM

## 2020-03-11 DIAGNOSIS — N6323 Unspecified lump in the left breast, lower outer quadrant: Secondary | ICD-10-CM

## 2020-03-11 DIAGNOSIS — Z87891 Personal history of nicotine dependence: Secondary | ICD-10-CM

## 2020-03-11 DIAGNOSIS — Z833 Family history of diabetes mellitus: Secondary | ICD-10-CM

## 2020-03-11 NOTE — Progress Notes (Signed)
Pt her to establish care for high risk breast cancer.

## 2020-03-12 NOTE — Progress Notes (Signed)
Hematology/Oncology Consult note [facility] [phone] [phone]   Patient Care Team: Patient, No Pcp Per as PCP - General (General Practice) Lorelee Market, MD (Family Medicine) Christene Lye, MD (General Surgery) Rico Junker, RN as Registered Nurse Theodore Demark, RN as Registered Nurse  REFERRING PROVIDER: Lloyd Huger, MD  CHIEF COMPLAINTS/REASON FOR VISIT:  Evaluation of high risk breast cancer  HISTORY OF PRESENTING ILLNESS:   Laura Petersen is a  34 y.o.  female with PMH listed below was seen in consultation at the request of  Lloyd Huger, MD  for evaluation of high risk breast cancer Patient noticed nipple discharge and left axillary palpable area after Covid 19 vaccination 02/28/2020, bilateral diagnostic mammogram There is probably benign 12 mm mass in the left breast at 1:00 anterior depth.  Recommend to repeat with ultrasound in 6 months for assessment of stability.  No mammographic or sonographic of etiology for bilateral nipple discharge.  Patient is married with 3 children. Menarche 47 years of age. Age at first childbirth was 63. Significant family history of breast cancer including breast cancer in maternal grandmother, maternal aunt, paternal aunt. Mother was diagnosed with cervical cancer.   Review of Systems  Constitutional: Negative for appetite change, chills, fatigue and fever.  HENT:   Negative for hearing loss and voice change.   Eyes: Negative for eye problems.  Respiratory: Negative for chest tightness and cough.   Cardiovascular: Negative for chest pain.  Gastrointestinal: Negative for abdominal distention, abdominal pain and blood in stool.  Endocrine: Negative for hot flashes.  Genitourinary: Negative for difficulty urinating and frequency.   Musculoskeletal: Negative for arthralgias.  Skin: Negative for itching and rash.  Neurological: Negative for extremity  weakness.  Hematological: Negative for adenopathy.  Psychiatric/Behavioral: Negative for confusion.    MEDICAL HISTORY:  Past Medical History:  Diagnosis Date  . Allergy   . Anxiety   . Asthma   . Depression   . GERD (gastroesophageal reflux disease)   . Headache   . Kidney stones   . Seizures ([place])     SURGICAL HISTORY: Past Surgical History:  Procedure Laterality Date  . CESAREAN SECTION    . CHOLECYSTECTOMY    . WISDOM TOOTH EXTRACTION  2014    SOCIAL HISTORY: Social History   Socioeconomic History  . Marital status: Single    Spouse name: Not on file  . Number of children: Not on file  . Years of education: Not on file  . Highest education level: Not on file  Occupational History  . Not on file  Tobacco Use  . Smoking status: Former Smoker    Packs/day: 1.00    Years: 3.00    Pack years: 3.00    Quit date: 09/09/2019    Years since quitting: 0.5  . Smokeless tobacco: Never Used  Substance and Sexual Activity  . Alcohol use: No  . Drug use: No  . Sexual activity: Not on file  Other Topics Concern  . Not on file  Social History Narrative  . Not on file   Social Determinants of Health   Financial Resource Strain:   . Difficulty of Paying Living Expenses: Not on file  Food Insecurity:   . Worried About Charity fundraiser in the Last Year: Not on file  . Ran Out of Food in the Last Year: Not on file  Transportation Needs:   . Lack of Transportation (Medical): Not on file  .  Lack of Transportation (Non-Medical): Not on file  Physical Activity:   . Days of Exercise per Week: Not on file  . Minutes of Exercise per Session: Not on file  Stress:   . Feeling of Stress : Not on file  Social Connections:   . Frequency of Communication with Friends and Family: Not on file  . Frequency of Social Gatherings with Friends and Family: Not on file  . Attends Religious Services: Not on file  . Active Member of Clubs or Organizations: Not on file  . Attends  Archivist Meetings: Not on file  . Marital Status: Not on file  Intimate Partner Violence:   . Fear of Current or Ex-Partner: Not on file  . Emotionally Abused: Not on file  . Physically Abused: Not on file  . Sexually Abused: Not on file    FAMILY HISTORY: Family History  Problem Relation Age of Onset  . Breast cancer Maternal Aunt 28  . Breast cancer Paternal Aunt 75  . Cervical cancer Mother   . Leukemia Mother   . Diabetes Mother   . Asthma Mother   . Breast cancer Maternal Grandmother 68  . Diabetes Father     ALLERGIES:  has No Known Allergies.  MEDICATIONS:  Current Outpatient Medications  Medication Sig Dispense Refill  . albuterol (PROVENTIL HFA;VENTOLIN HFA) 108 (90 Base) MCG/ACT inhaler Inhale 2 puffs into the lungs every 4 (four) hours as needed for wheezing or shortness of breath. 1 Inhaler 1  . clonazePAM (KLONOPIN) 0.5 MG tablet Take 0.5 mg by mouth 2 (two) times daily as needed for anxiety. (Patient not taking: Reported on 03/11/2020)    . clonazePAM (KLONOPIN) 0.5 MG tablet Take 1 tablet (0.5 mg total) by mouth 2 (two) times daily. 20 tablet 0  . DULoxetine (CYMBALTA) 30 MG capsule Take 30 mg by mouth daily. (Patient not taking: Reported on 03/11/2020)    . guaiFENesin-codeine (ROBITUSSIN AC) 100-10 MG/5ML syrup Take 5 mLs by mouth 3 (three) times daily as needed for cough. (Patient not taking: Reported on 03/11/2020) 120 mL 0  . ibuprofen (ADVIL,MOTRIN) 800 MG tablet Take 1 tablet (800 mg total) by mouth every 8 (eight) hours as needed. (Patient not taking: Reported on 03/11/2020) 30 tablet 0  . oxyCODONE-acetaminophen (ROXICET) 5-325 MG tablet Take 1 tablet by mouth every 4 (four) hours as needed for severe pain. (Patient not taking: Reported on 03/11/2020) 20 tablet 0   No current facility-administered medications for this visit.     PHYSICAL EXAMINATION: ECOG PERFORMANCE STATUS: 0 - Asymptomatic Vitals:   03/11/20 1516  BP: 121/88  Pulse: 97   Resp: 16  Temp: (!) 97.1 F (36.2 C)  SpO2: 97%   Filed Weights   03/11/20 1516  Weight: 187 lb 12.8 oz (85.2 kg)    Physical Exam Constitutional:      General: She is not in acute distress. HENT:     Head: Normocephalic and atraumatic.  Eyes:     General: No scleral icterus. Cardiovascular:     Rate and Rhythm: Normal rate and regular rhythm.     Heart sounds: Normal heart sounds.  Pulmonary:     Effort: Pulmonary effort is normal. No respiratory distress.     Breath sounds: No wheezing.  Abdominal:     General: Bowel sounds are normal. There is no distension.     Palpations: Abdomen is soft.  Musculoskeletal:        General: No deformity. Normal range of motion.  Cervical back: Normal range of motion and neck supple.  Skin:    General: Skin is warm and dry.     Findings: No erythema or rash.  Neurological:     Mental Status: She is alert and oriented to person, place, and time. Mental status is at baseline.     Cranial Nerves: No cranial nerve deficit.     Coordination: Coordination normal.  Psychiatric:        Mood and Affect: Mood normal.   Breast examination was performed No palpable breast masses bilaterally. not able to appreciate axillary lymphadenopathy bilaterally.  LABORATORY DATA:  I have reviewed the data as listed Lab Results  Component Value Date   WBC 8.2 04/07/2018   HGB 10.8 (L) 04/07/2018   HCT 33.9 (L) 04/07/2018   MCV 86.5 04/07/2018   PLT 371 04/07/2018   No results for input(s): NA, K, CL, CO2, GLUCOSE, BUN, CREATININE, CALCIUM, GFRNONAA, GFRAA, PROT, ALBUMIN, AST, ALT, ALKPHOS, BILITOT, BILIDIR, IBILI in the last 8760 hours. Iron/TIBC/Ferritin/ %Sat No results found for: IRON, TIBC, FERRITIN, IRONPCTSAT    RADIOGRAPHIC STUDIES: I have personally reviewed the radiological images as listed and agreed with the findings in the report. US BREAST LTD UNI LEFT INC AXILLA  Result Date: 02/28/2020 CLINICAL DATA:  One year history of  nipple discharge with manipulation. Strong family history of breast cancer. Lifetime risk of 21%. Patient also reports a palpable in the LEFT axilla after COVID vaccination which has since resolved. EXAM: DIGITAL DIAGNOSTIC BILATERAL MAMMOGRAM WITH TOMO AND CAD; ULTRASOUND RIGHT BREAST LIMITED; ULTRASOUND LEFT BREAST LIMITED COMPARISON:  None. ACR Breast Density Category c: The breast tissue is heterogeneously dense, which may obscure small masses. FINDINGS: Spot compression tomosynthesis were obtained over bilateral retroareolar regions. There is a circumscribed 10 mm oval mass in the LEFT upper retroareolar breast. No suspicious mass is seen in the RIGHT retroareolar region. A questioned asymmetry in the RIGHT outer breast resolves with additional views consistent with superimposed tissue. A questioned asymmetry in the LEFT outer breast resolves with additional views, consistent with superimposed tissue. Mammographic images were processed with CAD. Targeted ultrasound was performed of the RIGHT retroareolar region. No suspicious cystic or solid mass is seen. No intraductal mass is seen. Targeted ultrasound was performed of the LEFT retroareolar region. No suspicious intraductal mass is seen. Targeted ultrasound was performed of the LEFT breast at 1 o'clock 1 cm from the nipple. There is an oval hypoechoic circumscribed mass which measures 9 x 12 x 6 mm. This corresponds to the site of diagnostic mammographic concern. At the site of previous palpable concern, there are several benign LEFT axillary lymph nodes with echogenic hila and thin smooth cortices. No suspicious LEFT axillary lymph nodes are seen. IMPRESSION: 1. There is a probably benign 12 mm mass in the LEFT breast at 1 o'clock anterior depth. Recommend follow-up ultrasound in 6 months to assess for stability. 2. No mammographic or sonographic etiology for bilateral nipple discharge is identified. Any further workup of the patient's symptoms should be  based on the clinical assessment. 3. No suspicious LEFT axillary adenopathy. RECOMMENDATION: LEFT breast ultrasound in 6 months. This will establish 6 months of stability. The American Cancer Society recommends annual MRI and mammography in patients with an estimated lifetime risk of developing breast cancer greater than 20 - 25%, or who are known or suspected to be positive for the breast cancer gene. I have discussed the findings and recommendations with the patient. If applicable, a reminder letter  will be sent to the patient regarding the next appointment. BI-RADS CATEGORY  3: Probably benign. Electronically Signed   By: Valentino Saxon MD   On: 02/28/2020 10:23   US BREAST LTD UNI RIGHT INC AXILLA  Result Date: 02/28/2020 CLINICAL DATA:  One year history of nipple discharge with manipulation. Strong family history of breast cancer. Lifetime risk of 21%. Patient also reports a palpable in the LEFT axilla after COVID vaccination which has since resolved. EXAM: DIGITAL DIAGNOSTIC BILATERAL MAMMOGRAM WITH TOMO AND CAD; ULTRASOUND RIGHT BREAST LIMITED; ULTRASOUND LEFT BREAST LIMITED COMPARISON:  None. ACR Breast Density Category c: The breast tissue is heterogeneously dense, which may obscure small masses. FINDINGS: Spot compression tomosynthesis were obtained over bilateral retroareolar regions. There is a circumscribed 10 mm oval mass in the LEFT upper retroareolar breast. No suspicious mass is seen in the RIGHT retroareolar region. A questioned asymmetry in the RIGHT outer breast resolves with additional views consistent with superimposed tissue. A questioned asymmetry in the LEFT outer breast resolves with additional views, consistent with superimposed tissue. Mammographic images were processed with CAD. Targeted ultrasound was performed of the RIGHT retroareolar region. No suspicious cystic or solid mass is seen. No intraductal mass is seen. Targeted ultrasound was performed of the LEFT retroareolar  region. No suspicious intraductal mass is seen. Targeted ultrasound was performed of the LEFT breast at 1 o'clock 1 cm from the nipple. There is an oval hypoechoic circumscribed mass which measures 9 x 12 x 6 mm. This corresponds to the site of diagnostic mammographic concern. At the site of previous palpable concern, there are several benign LEFT axillary lymph nodes with echogenic hila and thin smooth cortices. No suspicious LEFT axillary lymph nodes are seen. IMPRESSION: 1. There is a probably benign 12 mm mass in the LEFT breast at 1 o'clock anterior depth. Recommend follow-up ultrasound in 6 months to assess for stability. 2. No mammographic or sonographic etiology for bilateral nipple discharge is identified. Any further workup of the patient's symptoms should be based on the clinical assessment. 3. No suspicious LEFT axillary adenopathy. RECOMMENDATION: LEFT breast ultrasound in 6 months. This will establish 6 months of stability. The American Cancer Society recommends annual MRI and mammography in patients with an estimated lifetime risk of developing breast cancer greater than 20 - 25%, or who are known or suspected to be positive for the breast cancer gene. I have discussed the findings and recommendations with the patient. If applicable, a reminder letter will be sent to the patient regarding the next appointment. BI-RADS CATEGORY  3: Probably benign. Electronically Signed   By: Valentino Saxon MD   On: 02/28/2020 10:23   MS DIGITAL DIAG TOMO BILAT  Result Date: 02/28/2020 CLINICAL DATA:  One year history of nipple discharge with manipulation. Strong family history of breast cancer. Lifetime risk of 21%. Patient also reports a palpable in the LEFT axilla after COVID vaccination which has since resolved. EXAM: DIGITAL DIAGNOSTIC BILATERAL MAMMOGRAM WITH TOMO AND CAD; ULTRASOUND RIGHT BREAST LIMITED; ULTRASOUND LEFT BREAST LIMITED COMPARISON:  None. ACR Breast Density Category c: The breast tissue  is heterogeneously dense, which may obscure small masses. FINDINGS: Spot compression tomosynthesis were obtained over bilateral retroareolar regions. There is a circumscribed 10 mm oval mass in the LEFT upper retroareolar breast. No suspicious mass is seen in the RIGHT retroareolar region. A questioned asymmetry in the RIGHT outer breast resolves with additional views consistent with superimposed tissue. A questioned asymmetry in the LEFT outer breast resolves with  additional views, consistent with superimposed tissue. Mammographic images were processed with CAD. Targeted ultrasound was performed of the RIGHT retroareolar region. No suspicious cystic or solid mass is seen. No intraductal mass is seen. Targeted ultrasound was performed of the LEFT retroareolar region. No suspicious intraductal mass is seen. Targeted ultrasound was performed of the LEFT breast at 1 o'clock 1 cm from the nipple. There is an oval hypoechoic circumscribed mass which measures 9 x 12 x 6 mm. This corresponds to the site of diagnostic mammographic concern. At the site of previous palpable concern, there are several benign LEFT axillary lymph nodes with echogenic hila and thin smooth cortices. No suspicious LEFT axillary lymph nodes are seen. IMPRESSION: 1. There is a probably benign 12 mm mass in the LEFT breast at 1 o'clock anterior depth. Recommend follow-up ultrasound in 6 months to assess for stability. 2. No mammographic or sonographic etiology for bilateral nipple discharge is identified. Any further workup of the patient's symptoms should be based on the clinical assessment. 3. No suspicious LEFT axillary adenopathy. RECOMMENDATION: LEFT breast ultrasound in 6 months. This will establish 6 months of stability. The American Cancer Society recommends annual MRI and mammography in patients with an estimated lifetime risk of developing breast cancer greater than 20 - 25%, or who are known or suspected to be positive for the breast cancer  gene. I have discussed the findings and recommendations with the patient. If applicable, a reminder letter will be sent to the patient regarding the next appointment. BI-RADS CATEGORY  3: Probably benign. Electronically Signed   By: Valentino Saxon MD   On: 02/28/2020 10:23      ASSESSMENT & PLAN:  1. Breast lump in lower-outer quadrant   2. Family history of breast cancer    #Family history of breast cancer. Patient is at risk of developing breast cancer.  Her Tyrer-Cuzick breast cancer risk I recommend patient to have genetic testing done for the determining risk of developing breast cancer. Consider annual mammogram alternating with MRI for screening.  Left breast mass, 36mm,   Follow-up image assessment in 6 months.   Orders Placed This Encounter  Procedures  . Ambulatory referral to Genetics    Referral Priority:   Routine    Referral Type:   Consultation    Referral Reason:   Specialty Services Required    Number of Visits Requested:   1    All questions were answered. The patient knows to call the clinic with any problems questions or concerns.   Return of visit: 3 months Thank you for this kind referral and the opportunity to participate in the care of this patient. A copy of today's note is routed to referring provider    Earlie Server, MD, PhD Hematology Oncology [facility] Pager- [phone] 03/12/2020

## 2020-03-26 ENCOUNTER — Encounter: Payer: Self-pay | Admitting: *Deleted

## 2020-04-02 ENCOUNTER — Inpatient Hospital Stay: Payer: Medicaid Other

## 2020-04-02 ENCOUNTER — Inpatient Hospital Stay: Payer: Medicaid Other | Attending: Oncology | Admitting: Licensed Clinical Social Worker

## 2020-04-17 ENCOUNTER — Other Ambulatory Visit: Payer: Self-pay | Admitting: *Deleted

## 2020-04-17 DIAGNOSIS — Z9189 Other specified personal risk factors, not elsewhere classified: Secondary | ICD-10-CM

## 2020-04-18 ENCOUNTER — Telehealth: Payer: Self-pay | Admitting: *Deleted

## 2020-04-26 ENCOUNTER — Encounter: Payer: Self-pay | Admitting: *Deleted

## 2020-04-26 NOTE — Progress Notes (Signed)
Letter mailed to inform patient of her 6 month follow up ultrasound and MRI on 08/28/20.

## 2020-06-08 ENCOUNTER — Encounter (INDEPENDENT_AMBULATORY_CARE_PROVIDER_SITE_OTHER): Payer: Self-pay

## 2020-06-12 ENCOUNTER — Inpatient Hospital Stay: Payer: Medicaid Other | Admitting: Oncology

## 2020-08-12 ENCOUNTER — Encounter: Payer: Self-pay | Admitting: Oncology

## 2020-08-28 ENCOUNTER — Ambulatory Visit
Admission: RE | Admit: 2020-08-28 | Discharge: 2020-08-28 | Disposition: A | Discharge status: Home / Self Care | Payer: Self-pay | Source: Ambulatory Visit | Attending: Oncology | Admitting: Oncology

## 2020-08-28 ENCOUNTER — Other Ambulatory Visit: Payer: Self-pay

## 2020-08-28 DIAGNOSIS — N63 Unspecified lump in unspecified breast: Secondary | ICD-10-CM | POA: Insufficient documentation

## 2020-08-28 DIAGNOSIS — Z9189 Other specified personal risk factors, not elsewhere classified: Secondary | ICD-10-CM | POA: Insufficient documentation

## 2020-08-28 MED ORDER — GADOBUTROL 1 MMOL/ML IV SOLN
8.0000 mL | Freq: Once | INTRAVENOUS | Status: AC | PRN
  Administered 2020-08-28: 8 mL via INTRAVENOUS

## 2021-04-16 ENCOUNTER — Ambulatory Visit: Payer: Medicaid Other

## 2021-05-07 ENCOUNTER — Other Ambulatory Visit: Payer: Self-pay

## 2021-05-07 DIAGNOSIS — D242 Benign neoplasm of left breast: Secondary | ICD-10-CM

## 2021-05-21 ENCOUNTER — Other Ambulatory Visit: Payer: Self-pay

## 2021-05-21 DIAGNOSIS — N6314 Unspecified lump in the right breast, lower inner quadrant: Secondary | ICD-10-CM

## 2021-05-28 ENCOUNTER — Other Ambulatory Visit: Payer: Self-pay

## 2021-05-28 ENCOUNTER — Other Ambulatory Visit: Payer: Self-pay | Admitting: *Deleted

## 2021-05-28 ENCOUNTER — Ambulatory Visit: Payer: Medicaid Other | Attending: Oncology | Admitting: *Deleted

## 2021-05-28 ENCOUNTER — Ambulatory Visit
Admission: RE | Admit: 2021-05-28 | Discharge: 2021-05-28 | Disposition: A | Discharge status: Home / Self Care | Payer: Medicaid Other | Source: Ambulatory Visit | Attending: Obstetrics and Gynecology | Admitting: Obstetrics and Gynecology

## 2021-05-28 VITALS — BP 101/59 | HR 95 | Wt 182.0 lb

## 2021-05-28 DIAGNOSIS — N6320 Unspecified lump in the left breast, unspecified quadrant: Secondary | ICD-10-CM

## 2021-05-28 DIAGNOSIS — N6310 Unspecified lump in the right breast, unspecified quadrant: Secondary | ICD-10-CM

## 2021-05-28 DIAGNOSIS — N6314 Unspecified lump in the right breast, lower inner quadrant: Secondary | ICD-10-CM | POA: Insufficient documentation

## 2021-05-28 DIAGNOSIS — N644 Mastodynia: Secondary | ICD-10-CM

## 2021-05-28 DIAGNOSIS — N6452 Nipple discharge: Secondary | ICD-10-CM

## 2021-05-28 DIAGNOSIS — Z01419 Encounter for gynecological examination (general) (routine) without abnormal findings: Secondary | ICD-10-CM

## 2021-05-28 DIAGNOSIS — D242 Benign neoplasm of left breast: Secondary | ICD-10-CM

## 2021-05-28 DIAGNOSIS — N898 Other specified noninflammatory disorders of vagina: Secondary | ICD-10-CM

## 2021-05-28 NOTE — Patient Instructions (Addendum)
Explained breast self awareness with Hermelinda Medicus. Pap smear completed today. Let her know BCCCP will cover Pap smears and HPV typing every 5 years unless has a history of abnormal Pap smears. Referred patient to [facility] for a diagnostic mammogram. Appointment scheduled Wednesday, May 28, 2021 at 1120. Patient aware of appointment and will be there. Let patient know will follow up with her within the next week with results of wet prep, breast discharge, and Pap smear by phone. Hermelinda Medicus verbalized understanding.  Arihant Pennings, Arvil Chaco, RN 8:59 AM

## 2021-05-28 NOTE — Progress Notes (Signed)
Laura Petersen is a 36 y.o. [phone] female who presents to [facility] clinic today with complaint of left breast lump that she first noticed the end of October 2021 that has been followed. Patient stated she felt lump at first but is unable to feel lump now. Patient complained of bilateral yellowish/greenish spontaneous nipple discharge x 3-4 years. Patient stated she has a history of breast cysts. Patient complained of bilateral diffuse breast pain x 3 years that comes and goes. Patient stated the pain is worse when touched. Patient rates the pain at a 1-2 out of 10 when not touched and a 9 out of 10 when touched.   Pap Smear: Pap smear completed today. Last Pap smear was 02/28/2015 at [facility] and was normal per patient. Per patient has history of an abnormal Pap smear when she was 36 years old that was HPV positive. Patient stated no follow up was completed and that all Pap smears have been normal since with negative HPV. Last Pap smear result is not available in Epic.   Physical exam: Breasts Breasts symmetrical. No skin abnormalities left breast. Observed a brown spot right breast at 6 o'clock 3 cm from nipple that patient stated she first noticed two months ago. No nipple retraction bilateral breasts. Bilateral greenish colored discharge expressed on exam. Sample of bilateral breast discharge scent to Cytology for evaluation. No lymphadenopathy. No lumps palpated bilateral breasts. Complaints of bilateral diffuse breast pain on exam that is greater within the nipple area.      MS DIGITAL DIAG TOMO BILAT  Result Date: 02/28/2020 CLINICAL DATA:  One year history of nipple discharge with manipulation. Strong family history of breast cancer. Lifetime risk of 21%. Patient also reports a palpable in the LEFT axilla after COVID vaccination which has since resolved. EXAM: DIGITAL DIAGNOSTIC BILATERAL MAMMOGRAM WITH TOMO AND CAD; ULTRASOUND RIGHT BREAST LIMITED; ULTRASOUND LEFT BREAST LIMITED  COMPARISON:  None. ACR Breast Density Category c: The breast tissue is heterogeneously dense, which may obscure small masses. FINDINGS: Spot compression tomosynthesis were obtained over bilateral retroareolar regions. There is a circumscribed 10 mm oval mass in the LEFT upper retroareolar breast. No suspicious mass is seen in the RIGHT retroareolar region. A questioned asymmetry in the RIGHT outer breast resolves with additional views consistent with superimposed tissue. A questioned asymmetry in the LEFT outer breast resolves with additional views, consistent with superimposed tissue. Mammographic images were processed with CAD. Targeted ultrasound was performed of the RIGHT retroareolar region. No suspicious cystic or solid mass is seen. No intraductal mass is seen. Targeted ultrasound was performed of the LEFT retroareolar region. No suspicious intraductal mass is seen. Targeted ultrasound was performed of the LEFT breast at 1 o'clock 1 cm from the nipple. There is an oval hypoechoic circumscribed mass which measures 9 x 12 x 6 mm. This corresponds to the site of diagnostic mammographic concern. At the site of previous palpable concern, there are several benign LEFT axillary lymph nodes with echogenic hila and thin smooth cortices. No suspicious LEFT axillary lymph nodes are seen. IMPRESSION: 1. There is a probably benign 12 mm mass in the LEFT breast at 1 o'clock anterior depth. Recommend follow-up ultrasound in 6 months to assess for stability. 2. No mammographic or sonographic etiology for bilateral nipple discharge is identified. Any further workup of the patient's symptoms should be based on the clinical assessment. 3. No suspicious LEFT axillary adenopathy. RECOMMENDATION: LEFT breast ultrasound in 6 months. This will establish 6 months of  stability. The American Cancer Society recommends annual MRI and mammography in patients with an estimated lifetime risk of developing breast cancer greater than 20 -  25%, or who are known or suspected to be positive for the breast cancer gene. I have discussed the findings and recommendations with the patient. If applicable, a reminder letter will be sent to the patient regarding the next appointment. BI-RADS CATEGORY  3: Probably benign. Electronically Signed   By: Valentino Saxon MD   On: 02/28/2020 10:23    Pelvic/Bimanual Ext Genitalia No lesions, no swelling and no discharge observed on external genitalia.        Vagina Vagina pink and normal texture. No lesions and thick white yeast appearing discharge observed in vagina. Wet prep completed.        Cervix Cervix is present. Cervix pink and of normal texture. Thick white yeast appearing discharge observed on cervix.   Uterus Uterus is present and palpable. Uterus in normal position and normal size.        Adnexae Bilateral ovaries present and palpable. No tenderness on palpation.         Rectovaginal No rectal exam completed today since patient had no rectal complaints. No skin abnormalities observed on exam.     Smoking History: Patient is a former smoker that quit in May 2021 that started vaping 04/15/2021. Discussed smoking cessation with patient and referred to the [facility] Quitline.   Patient Navigation: Patient education provided. Access to services provided for patient through BCCCP program.    Breast and Cervical Cancer Risk Assessment: Patient has family history of maternal aunt, maternal grandmother, and paternal aunt having breast cancer. Patient has no known genetic mutations or history of radiation treatment to the chest before age 81. Patient does not have history of cervical dysplasia, immunocompromised, or DES exposure in-utero.  Risk Assessment     Risk Scores       05/28/2021 02/28/2020   Last edited by: Drue Dun, RN [place], Laddie Aquas, [place]   5-year risk: 0.3 % 0.2 %   Lifetime risk: 7.4 % 7.5 %            A: BCCCP exam with pap smear Complaint of bilateral  breast pain and discharge.  P: Referred patient to [facility] for a diagnostic mammogram. Appointment scheduled Wednesday, May 28, 2021 at 1120.  Loletta Parish, RN 05/28/2021 8:59 AM

## 2021-05-29 ENCOUNTER — Other Ambulatory Visit: Payer: Self-pay | Admitting: Obstetrics and Gynecology

## 2021-05-29 LAB — CERVICOVAGINAL ANCILLARY ONLY
Bacterial Vaginitis (gardnerella): POSITIVE — AB
Candida Glabrata: NEGATIVE
Candida Vaginitis: NEGATIVE
Comment: NEGATIVE
Comment: NEGATIVE
Comment: NEGATIVE
Comment: NEGATIVE
Trichomonas: NEGATIVE

## 2021-05-29 LAB — CYTOLOGY - NON PAP

## 2021-05-29 MED ORDER — METRONIDAZOLE 500 MG PO TABS: 500.0000 mg | ORAL_TABLET | Freq: Two times a day (BID) | ORAL | 0 refills | Status: AC

## 2021-05-30 LAB — CYTOLOGY - PAP
Comment: NEGATIVE
Diagnosis: UNDETERMINED — AB
High risk HPV: POSITIVE — AB

## 2021-06-02 ENCOUNTER — Telehealth: Payer: Self-pay

## 2021-06-02 NOTE — Telephone Encounter (Signed)
Called patient to give the following lab results:  Pap: ASCUS, +HPV, Patient will need colpo for follow-up, will refer to [place].  Wet prep: Wet prep showed BV. Flagyl has been prescribed to be taken BID for 7 days. Rx. has been sent to [facility] on [address] in [place]. No alcohol while taking this medication.  Breast Discharge: Benign, no malignant cells were found in either R breast of L breast sample. Next R breast US will be due in 6 months.  Patient voiced understanding.

## 2021-07-03 ENCOUNTER — Ambulatory Visit: Payer: Self-pay | Admitting: Obstetrics and Gynecology

## 2021-07-04 ENCOUNTER — Ambulatory Visit: Payer: Self-pay | Admitting: Obstetrics & Gynecology

## 2021-07-08 ENCOUNTER — Ambulatory Visit (INDEPENDENT_AMBULATORY_CARE_PROVIDER_SITE_OTHER): Payer: Self-pay | Admitting: Obstetrics & Gynecology

## 2021-07-08 ENCOUNTER — Other Ambulatory Visit (HOSPITAL_COMMUNITY)
Admission: RE | Admit: 2021-07-08 | Discharge: 2021-07-08 | Disposition: A | Discharge status: Home / Self Care | Payer: Medicaid Other | Source: Ambulatory Visit | Attending: Obstetrics and Gynecology | Admitting: Obstetrics and Gynecology

## 2021-07-08 ENCOUNTER — Other Ambulatory Visit: Payer: Self-pay

## 2021-07-08 ENCOUNTER — Encounter: Payer: Self-pay | Admitting: Obstetrics & Gynecology

## 2021-07-08 VITALS — BP 120/80 | Ht 60.0 in | Wt 183.0 lb

## 2021-07-08 DIAGNOSIS — R87810 Cervical high risk human papillomavirus (HPV) DNA test positive: Secondary | ICD-10-CM | POA: Insufficient documentation

## 2021-07-08 DIAGNOSIS — R87610 Atypical squamous cells of undetermined significance on cytologic smear of cervix (ASC-US): Secondary | ICD-10-CM

## 2021-07-08 NOTE — Progress Notes (Signed)
?Referring Provider:  [facility] ? ?HPI:  ?Laura Petersen is a 36 y.o.  [phone]  who presents today for evaluation and management of abnormal cervical cytology.   ? ?Dysplasia History:  ASCUS, POS HPV ? ?ROS:  Pertinent items are noted in HPI. ? ?OB History  ?Gravida Para Term Preterm AB Living  ?'4 3     1 3  '$ ?SAB IAB Ectopic Multiple Live Births  ?1          ?  ?# Outcome Date GA Lbr Len/2nd Weight Sex Delivery Anes PTL Lv  ?4 Para           ?3 Para           ?2 SAB           ?1 Para           ?  ?Obstetric Comments  ?1st Menstrual Cycle:  10  ?1st Pregnancy:  14  ? ? ?Past Medical History:  ?Diagnosis Date  ? Allergy   ? Anxiety   ? Asthma   ? Depression   ? GERD (gastroesophageal reflux disease)   ? Headache   ? Kidney stones   ? Seizures ([place])   ? ? ?Past Surgical History:  ?Procedure Laterality Date  ? CESAREAN SECTION    ? CHOLECYSTECTOMY    ? TUBAL LIGATION    ? WISDOM TOOTH EXTRACTION  04/20/2012  ? ? ?SOCIAL HISTORY: ? ?Social History  ? ?Substance and Sexual Activity  ?Alcohol Use No  ? ? ?Social History  ? ?Substance and Sexual Activity  ?Drug Use No  ? ? ? ?Family History  ?Problem Relation Age of Onset  ? Breast cancer Maternal Aunt 64  ? Breast cancer Paternal Aunt 104  ? Cervical cancer Mother   ? Leukemia Mother   ? Diabetes Mother   ? Asthma Mother   ? Breast cancer Maternal Grandmother 36  ? Diabetes Father   ? ? ?ALLERGIES:  Patient has no known allergies. ? ?Current Outpatient Medications on File Prior to Visit  ?Medication Sig Dispense Refill  ? albuterol (PROVENTIL HFA;VENTOLIN HFA) 108 (90 Base) MCG/ACT inhaler Inhale 2 puffs into the lungs every 4 (four) hours as needed for wheezing or shortness of breath. 1 Inhaler 1  ? metroNIDAZOLE (FLAGYL) 500 MG tablet Take 1 tablet (500 mg total) by mouth 2 (two) times daily. (Patient not taking: Reported on 07/08/2021) 14 tablet 0  ? ?No current facility-administered medications on file prior to visit.  ? ? ?Physical Exam: ?-Vitals:  ?BP 120/80   Ht 5'  (1.524 m)   Wt 183 lb (83 kg)   LMP 06/29/2021   BMI 35.74 kg/m?  ?GEN: WD, WN, NAD.  A+ O x 3, good mood and affect. ?ABD:  NT, ND.  Soft, no masses.  No hernias noted. ?  Pelvic:   ?Vulva: Normal appearance.  No lesions.  ?Vagina: No lesions or abnormalities noted.  ?Support: Normal pelvic support.  ?Urethra No masses tenderness or scarring.  ?Meatus Normal size without lesions or prolapse.  ?Cervix: See below.  ?Anus: Normal exam.  No lesions.  ?Perineum: Normal exam.  No lesions.  ?      Bimanual   ?Uterus: Normal size.  Non-tender.  Mobile.  AV.  ?Adnexae: No masses.  Non-tender to palpation.  ?Cul-de-sac: Negative for abnormality.  ? ?PROCEDURE: ?1.  Urine Pregnancy Test:  not done ?2.  Colposcopy performed with 4% acetic acid after verbal consent obtained ?             ?             -  Narrow cervical os and TZ ?             -Aceto-white Lesions Location(s): 6 o'clock.  Very small. ?             -Biopsy performed at 6, 12 o'clock  ?             -ECC indicated and performed: Yes.   ?    -Biopsy sites made hemostatic with pressure, AgNO3, and/or Monsel's solution ?  -Satisfactory colposcopy: Yes.    ?  -Evidence of Invasive cervical CA :  NO ? ?ASSESSMENT:  Laura Petersen is a 36 y.o. [phone] here for  ?1. ASCUS with positive high risk HPV cervical   ?. ? ?PLAN: ?1.  I discussed the grading system of pap smears and HPV high risk viral types.  We will discuss and base management after colpo results return. ?2. Follow up PAP 6 months, vs intervention if high grade dysplasia identified ?3. Treatment of persistantly abnormal PAP smears and cervical dysplasia, even mild, is discussed w pt today in detail, as well as the pros and cons of Cryo and LEEP procedures. Will consider and discuss after results. ? ?    ?Barnett Applebaum, MD, FACOG ?[facility], [place] ?07/08/2021  2:04 PM ? ?

## 2021-07-08 NOTE — Patient Instructions (Signed)
Colposcopy, Care After ?The following information offers guidance on how to care for yourself after your procedure. Your health care provider may also give you more specific instructions. If you have problems or questions, contact your health care provider. ?What can I expect after the procedure? ?If you had a colposcopy without a biopsy, you can expect to feel fine right away after your procedure. However, you may have some spotting of blood for a few days. You can return to your normal activities. ?If you had a colposcopy with a biopsy, it is common after the procedure to have: ?Soreness and mild pain. These may last for a few days. ?Mild vaginal bleeding or discharge that is dark-colored and grainy. This may last for a few days. The discharge may be caused by a liquid (solution) that was used during the procedure. You may need to wear a sanitary pad during this time. ?Spotting of blood for at least 48 hours after the procedure. ?Follow these instructions at home: ?Medicines ?Take over-the-counter and prescription medicines only as told by your health care provider. ?Talk with your health care provider about what type of over-the-counter pain medicines and prescription medicines you can start to take again. It is especially important to talk with your health care provider if you take blood thinners. ?Activity ?Avoid using douche products, using tampons, and having sex for at least 3 days after the procedure or for as long as told by your health care provider. ?Return to your normal activities as told by your health care provider. Ask your health care provider what activities are safe for you. ?General instructions ?Ask your health care provider if you may take baths, swim, or use a hot tub. You may take showers. ?If you use birth control (contraception), continue to use it. ?Keep all follow-up visits. This is important. ?Contact a health care provider if: ?You have a fever or chills. ?You faint or feel  light-headed. ?Get help right away if: ?You have heavy bleeding from your vagina or pass blood clots. Heavy bleeding is bleeding that soaks through a sanitary pad in less than 1 hour. ?You have vaginal discharge that is abnormal, is yellow in color, or smells bad. This could be a sign of infection. ?You have severe pain or cramps in your lower abdomen that do not go away with medicine. ?Summary ?If you had a colposcopy without a biopsy, you can expect to feel fine right away, but you may have some spotting of blood for a few days. You can return to your normal activities. ?If you had a colposcopy with a biopsy, it is common to have mild pain for a few days and spotting for 48 hours after the procedure. ?Avoid using douche products, using tampons, and having sex for at least 3 days after the procedure or for as long as told by your health care provider. ?Get help right away if you have heavy bleeding, severe pain, or signs of infection. ?This information is not intended to replace advice given to you by your health care provider. Make sure you discuss any questions you have with your health care provider. ?Document Revised: 09/01/2020 Document Reviewed: 09/01/2020 ?Elsevier Patient Education ? 2022 Elsevier Inc[employer] ? ?

## 2021-07-11 LAB — SURGICAL PATHOLOGY

## 2021-08-26 ENCOUNTER — Other Ambulatory Visit: Payer: Self-pay

## 2021-08-26 DIAGNOSIS — N6314 Unspecified lump in the right breast, lower inner quadrant: Secondary | ICD-10-CM

## 2021-08-26 DIAGNOSIS — N6323 Unspecified lump in the left breast, lower outer quadrant: Secondary | ICD-10-CM

## 2021-11-27 ENCOUNTER — Ambulatory Visit
Admission: RE | Admit: 2021-11-27 | Discharge: 2021-11-27 | Disposition: A | Discharge status: Home / Self Care | Payer: Medicaid Other | Source: Ambulatory Visit | Attending: Obstetrics and Gynecology | Admitting: Obstetrics and Gynecology

## 2021-11-27 ENCOUNTER — Inpatient Hospital Stay: Admission: RE | Admit: 2021-11-27 | Payer: Medicaid Other | Source: Ambulatory Visit

## 2021-11-27 ENCOUNTER — Other Ambulatory Visit: Payer: Medicaid Other

## 2021-11-27 DIAGNOSIS — N6314 Unspecified lump in the right breast, lower inner quadrant: Secondary | ICD-10-CM | POA: Insufficient documentation

## 2022-01-05 ENCOUNTER — Ambulatory Visit: Payer: Medicaid Other | Admitting: Obstetrics & Gynecology

## 2022-01-13 ENCOUNTER — Ambulatory Visit: Payer: Medicaid Other | Attending: Hematology and Oncology | Admitting: Hematology and Oncology

## 2022-01-13 ENCOUNTER — Other Ambulatory Visit: Payer: Self-pay

## 2022-01-13 ENCOUNTER — Encounter: Payer: Self-pay | Admitting: Hematology and Oncology

## 2022-01-13 DIAGNOSIS — Z124 Encounter for screening for malignant neoplasm of cervix: Secondary | ICD-10-CM

## 2022-01-13 DIAGNOSIS — R87610 Atypical squamous cells of undetermined significance on cytologic smear of cervix (ASC-US): Secondary | ICD-10-CM

## 2022-01-13 NOTE — Progress Notes (Signed)
[name] is a 36 y.o. [phone] female who presents to [facility] clinic today with no complaints. 6 month follow up for abnormal pap smear.    Pap Smear: Pap smear completed today. Last Pap smear was 05/2021 at [facility] clinic and was abnormal - ASCUS with positive HPV . Per patient has no history of an abnormal Pap smear. Last Pap smear result is available in Epic. 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        Vagina Vagina pink and normal texture. No lesions or discharge observed in vagina.        Cervix Cervix is present. Cervix pink and of normal texture. No discharge observed.    Uterus Uterus is present and palpable. Uterus in normal position and normal size.        Adnexae Bilateral ovaries present and palpable. No tenderness on palpation.         Rectovaginal No rectal exam completed today since patient had no rectal complaints. No skin abnormalities observed on exam.     Smoking History: Patient has is a former smoker and was not referred to quit line.    Patient Navigation: Patient education provided. Access to services provided for patient through [facility] program. No interpreter provided. No transportation provided   Colorectal Cancer Screening: Per patient has never had colonoscopy completed No complaints today.    Breast and Cervical Cancer Risk Assessment: Patient does not have family history of breast cancer, known genetic mutations, or radiation treatment to the chest before age 66. Patient has history of cervical dysplasia.  Risk Assessment   No risk assessment data for the current encounter  Risk Scores       05/28/2021   Last edited by: Drue Dun, RN   5-year risk: 0.3 %   Lifetime risk: 7.4 %            A: 6 month follow up abnormal pap.    P: We obtained repeat pap smear today.    Melodye Ped, NP 01/13/2022 11:28 AM

## 2022-01-21 LAB — CYTOLOGY - PAP
Adequacy: ABSENT
Comment: NEGATIVE
Diagnosis: NEGATIVE
High risk HPV: NEGATIVE

## 2022-07-21 ENCOUNTER — Other Ambulatory Visit: Payer: Self-pay

## 2022-07-21 DIAGNOSIS — N6320 Unspecified lump in the left breast, unspecified quadrant: Secondary | ICD-10-CM

## 2022-07-21 DIAGNOSIS — N6310 Unspecified lump in the right breast, unspecified quadrant: Secondary | ICD-10-CM

## 2022-08-03 ENCOUNTER — Other Ambulatory Visit: Payer: Medicaid Other

## 2022-08-03 ENCOUNTER — Ambulatory Visit: Payer: Medicaid Other

## 2022-08-10 ENCOUNTER — Ambulatory Visit
Admission: RE | Admit: 2022-08-10 | Discharge: 2022-08-10 | Disposition: A | Discharge status: Home / Self Care | Payer: Self-pay | Source: Ambulatory Visit | Attending: Obstetrics and Gynecology | Admitting: Obstetrics and Gynecology

## 2022-08-10 ENCOUNTER — Ambulatory Visit: Payer: Self-pay | Attending: Hematology and Oncology | Admitting: Hematology and Oncology

## 2022-08-10 ENCOUNTER — Other Ambulatory Visit: Payer: Self-pay

## 2022-08-10 VITALS — BP 115/77 | Wt 181.4 lb

## 2022-08-10 DIAGNOSIS — N6320 Unspecified lump in the left breast, unspecified quadrant: Secondary | ICD-10-CM

## 2022-08-10 DIAGNOSIS — N6310 Unspecified lump in the right breast, unspecified quadrant: Secondary | ICD-10-CM

## 2022-08-10 DIAGNOSIS — Z01419 Encounter for gynecological examination (general) (routine) without abnormal findings: Secondary | ICD-10-CM

## 2022-08-10 NOTE — Patient Instructions (Signed)
Taught Laura Petersen about self breast exam and gave educational materials to take home. Patient did need a Pap smear today due to last Pap smear was in 2023 per patient. Told patient about free cervical cancer screenings to receive a Pap smear if would like one next year. Let her know [facility] will cover Pap smears every 5 years unless has a history of abnormal Pap smears. [facility]. Appointment scheduled for 08/10/22. Patient aware of appointment and will be there. Let patient know will follow up with her within the next couple weeks with results. Laura Petersen verbalized understanding.  Laura Lux, NP 1:07 PM

## 2022-08-10 NOTE — Progress Notes (Signed)
[name] is a 37 y.o. female who presents to [facility] clinic today with no complaints.    Pap Smear: Pap smear completed today. Last Pap smear was 2023 at [facility] clinic and was normal. Per patient has history of an abnormal Pap smear. Last Pap smear result is available in Epic. 2023 with ASCUS and HPV positive. Discussed will need annual exams until 3 normal in a row.   Physical exam: Breasts Breasts symmetrical. No skin abnormalities bilateral breasts. No nipple retraction bilateral breasts. No nipple discharge bilateral breasts. No lymphadenopathy. No lumps palpated bilateral breasts.   MS DIGITAL DIAG TOMO BILAT  Result Date: 05/28/2021 CLINICAL DATA:  One year follow-up of probably benign left breast mass. The patient is in need of a second-look ultrasound for enhancing right breast mass seen on MRI dated May 2022. Persistent spontaneous and nonspontaneous greenish nipple discharge. EXAM: DIGITAL DIAGNOSTIC BILATERAL MAMMOGRAM WITH TOMOSYNTHESIS AND CAD; ULTRASOUND RIGHT BREAST LIMITED; ULTRASOUND LEFT BREAST LIMITED TECHNIQUE: Bilateral digital diagnostic mammography and breast tomosynthesis was performed. The images were evaluated with computer-aided detection.; Targeted ultrasound examination of the right breast was performed; Targeted ultrasound examination of the left breast was performed. COMPARISON:  Previous exam(s). ACR Breast Density Category b: There are scattered areas of fibroglandular density. FINDINGS: Mammographically, there are no suspicious masses, areas of architectural distortion or microcalcifications in either breast. Stable left upper retroareolar 1 cm circumscribed mass. Targeted right breast ultrasound is performed demonstrating 4 o'clock 4 cm from the nipple hypoechoic circumscribed horizontally oriented septated mass measuring 0.9 x 0.7 x 0.3 cm. This finding likely corresponds to the mass seen on patient's MRI. Targeted left breast ultrasound is performed demonstrating  1 o'clock 1 cm from the nipple stable hypoechoic circumscribed mass measuring 1.2 x 0.5 x 0.9 cm. IMPRESSION: Right breast 4 o'clock probably benign mass, for which six-month follow-up is recommended. Left breast 1 o'clock probably benign mass for which 12 month follow-up is recommended. RECOMMENDATION: Focused right breast ultrasound in 6 months. I have discussed the findings and recommendations with the patient. If applicable, a reminder letter will be sent to the patient regarding the next appointment. BI-RADS CATEGORY  3: Probably benign. Electronically Signed   By: Ted Mcalpine M.D.   On: 05/28/2021 12:22  MS DIGITAL DIAG TOMO BILAT  Result Date: 02/28/2020 CLINICAL DATA:  One year history of nipple discharge with manipulation. Strong family history of breast cancer. Lifetime risk of 21%. Patient also reports a palpable in the LEFT axilla after COVID vaccination which has since resolved. EXAM: DIGITAL DIAGNOSTIC BILATERAL MAMMOGRAM WITH TOMO AND CAD; ULTRASOUND RIGHT BREAST LIMITED; ULTRASOUND LEFT BREAST LIMITED COMPARISON:  None. ACR Breast Density Category c: The breast tissue is heterogeneously dense, which may obscure small masses. FINDINGS: Spot compression tomosynthesis were obtained over bilateral retroareolar regions. There is a circumscribed 10 mm oval mass in the LEFT upper retroareolar breast. No suspicious mass is seen in the RIGHT retroareolar region. A questioned asymmetry in the RIGHT outer breast resolves with additional views consistent with superimposed tissue. A questioned asymmetry in the LEFT outer breast resolves with additional views, consistent with superimposed tissue. Mammographic images were processed with CAD. Targeted ultrasound was performed of the RIGHT retroareolar region. No suspicious cystic or solid mass is seen. No intraductal mass is seen. Targeted ultrasound was performed of the LEFT retroareolar region. No suspicious intraductal mass is seen. Targeted  ultrasound was performed of the LEFT breast at 1 o'clock 1 cm from the nipple. There is an  oval hypoechoic circumscribed mass which measures 9 x 12 x 6 mm. This corresponds to the site of diagnostic mammographic concern. At the site of previous palpable concern, there are several benign LEFT axillary lymph nodes with echogenic hila and thin smooth cortices. No suspicious LEFT axillary lymph nodes are seen. IMPRESSION: 1. There is a probably benign 12 mm mass in the LEFT breast at 1 o'clock anterior depth. Recommend follow-up ultrasound in 6 months to assess for stability. 2. No mammographic or sonographic etiology for bilateral nipple discharge is identified. Any further workup of the patient's symptoms should be based on the clinical assessment. 3. No suspicious LEFT axillary adenopathy. RECOMMENDATION: LEFT breast ultrasound in 6 months. This will establish 6 months of stability. The American Cancer Society recommends annual MRI and mammography in patients with an estimated lifetime risk of developing breast cancer greater than 20 - 25%, or who are known or suspected to be positive for the breast cancer gene. I have discussed the findings and recommendations with the patient. If applicable, a reminder letter will be sent to the patient regarding the next appointment. BI-RADS CATEGORY  3: Probably benign. Electronically Signed   By: Meda Klinefelter MD   On: 02/28/2020 10:23        Pelvic/Bimanual Ext Genitalia No lesions, no swelling and no discharge observed on external genitalia.        Vagina Vagina pink and normal texture. No lesions or discharge observed in vagina.        Cervix Cervix is present. Cervix pink and of normal texture. No discharge observed.    Uterus Uterus is present and palpable. Uterus in normal position and normal size.        Adnexae Bilateral ovaries present and palpable. No tenderness on palpation.         Rectovaginal No rectal exam completed today since patient had  no rectal complaints. No skin abnormalities observed on exam.     Physical exam: Breasts Breasts symmetrical. No skin abnormalities bilateral breasts. No nipple retraction bilateral breasts. No nipple discharge bilateral breasts. No lymphadenopathy. No lumps palpated bilateral breasts.  MS DIGITAL DIAG TOMO BILAT  Result Date: 05/28/2021 CLINICAL DATA:  One year follow-up of probably benign left breast mass. The patient is in need of a second-look ultrasound for enhancing right breast mass seen on MRI dated May 2022. Persistent spontaneous and nonspontaneous greenish nipple discharge. EXAM: DIGITAL DIAGNOSTIC BILATERAL MAMMOGRAM WITH TOMOSYNTHESIS AND CAD; ULTRASOUND RIGHT BREAST LIMITED; ULTRASOUND LEFT BREAST LIMITED TECHNIQUE: Bilateral digital diagnostic mammography and breast tomosynthesis was performed. The images were evaluated with computer-aided detection.; Targeted ultrasound examination of the right breast was performed; Targeted ultrasound examination of the left breast was performed. COMPARISON:  Previous exam(s). ACR Breast Density Category b: There are scattered areas of fibroglandular density. FINDINGS: Mammographically, there are no suspicious masses, areas of architectural distortion or microcalcifications in either breast. Stable left upper retroareolar 1 cm circumscribed mass. Targeted right breast ultrasound is performed demonstrating 4 o'clock 4 cm from the nipple hypoechoic circumscribed horizontally oriented septated mass measuring 0.9 x 0.7 x 0.3 cm. This finding likely corresponds to the mass seen on patient's MRI. Targeted left breast ultrasound is performed demonstrating 1 o'clock 1 cm from the nipple stable hypoechoic circumscribed mass measuring 1.2 x 0.5 x 0.9 cm. IMPRESSION: Right breast 4 o'clock probably benign mass, for which six-month follow-up is recommended. Left breast 1 o'clock probably benign mass for which 12 month follow-up is recommended. RECOMMENDATION: Focused  right breast ultrasound  in 6 months. I have discussed the findings and recommendations with the patient. If applicable, a reminder letter will be sent to the patient regarding the next appointment. BI-RADS CATEGORY  3: Probably benign. Electronically Signed   By: Ted Mcalpine M.D.   On: 05/28/2021 12:22  MS DIGITAL DIAG TOMO BILAT  Result Date: 02/28/2020 CLINICAL DATA:  One year history of nipple discharge with manipulation. Strong family history of breast cancer. Lifetime risk of 21%. Patient also reports a palpable in the LEFT axilla after COVID vaccination which has since resolved. EXAM: DIGITAL DIAGNOSTIC BILATERAL MAMMOGRAM WITH TOMO AND CAD; ULTRASOUND RIGHT BREAST LIMITED; ULTRASOUND LEFT BREAST LIMITED COMPARISON:  None. ACR Breast Density Category c: The breast tissue is heterogeneously dense, which may obscure small masses. FINDINGS: Spot compression tomosynthesis were obtained over bilateral retroareolar regions. There is a circumscribed 10 mm oval mass in the LEFT upper retroareolar breast. No suspicious mass is seen in the RIGHT retroareolar region. A questioned asymmetry in the RIGHT outer breast resolves with additional views consistent with superimposed tissue. A questioned asymmetry in the LEFT outer breast resolves with additional views, consistent with superimposed tissue. Mammographic images were processed with CAD. Targeted ultrasound was performed of the RIGHT retroareolar region. No suspicious cystic or solid mass is seen. No intraductal mass is seen. Targeted ultrasound was performed of the LEFT retroareolar region. No suspicious intraductal mass is seen. Targeted ultrasound was performed of the LEFT breast at 1 o'clock 1 cm from the nipple. There is an oval hypoechoic circumscribed mass which measures 9 x 12 x 6 mm. This corresponds to the site of diagnostic mammographic concern. At the site of previous palpable concern, there are several benign LEFT axillary lymph nodes with  echogenic hila and thin smooth cortices. No suspicious LEFT axillary lymph nodes are seen. IMPRESSION: 1. There is a probably benign 12 mm mass in the LEFT breast at 1 o'clock anterior depth. Recommend follow-up ultrasound in 6 months to assess for stability. 2. No mammographic or sonographic etiology for bilateral nipple discharge is identified. Any further workup of the patient's symptoms should be based on the clinical assessment. 3. No suspicious LEFT axillary adenopathy. RECOMMENDATION: LEFT breast ultrasound in 6 months. This will establish 6 months of stability. The American Cancer Society recommends annual MRI and mammography in patients with an estimated lifetime risk of developing breast cancer greater than 20 - 25%, or who are known or suspected to be positive for the breast cancer gene. I have discussed the findings and recommendations with the patient. If applicable, a reminder letter will be sent to the patient regarding the next appointment. BI-RADS CATEGORY  3: Probably benign. Electronically Signed   By: Meda Klinefelter MD   On: 02/28/2020 10:23        Smoking History: Patient has is a former smoker and was not referred to quit line.    Patient Navigation: Patient education provided. Access to services provided for patient through [facility] program. No interpreter provided. No transportation provided   Colorectal Cancer Screening: Per patient has never had colonoscopy completed No complaints today.    Breast and Cervical Cancer Risk Assessment: Patient has family history of breast cancer with maternal grandmother, aunt and paternal aunt. Patient has history of cervical dysplasia, immunocompromised, or DES exposure in-utero.  Risk Scores as of 08/10/2022     Laura Petersen           5-year 0.29 %   Lifetime 6.76 %  Last calculated by Narda Rutherford, LPN on 1/61/0960 at 12:53 PM        A: [facility] exam without pap smear No complaints with benign exam. Follow up of  probable benign right breast mass.   P: [facility]. Appointment scheduled 08/10/22.  Pascal Lux, NP 08/10/2022 12:53 PM

## 2022-08-14 ENCOUNTER — Telehealth: Payer: Self-pay

## 2022-08-14 LAB — CYTOLOGY - PAP
Adequacy: ABSENT
Comment: NEGATIVE
Diagnosis: NEGATIVE
High risk HPV: NEGATIVE

## 2022-08-14 NOTE — Telephone Encounter (Signed)
Patient informed negative Pap/HPV results, repeat in 1 year (due to history). Patient verbalized understanding.

## 2022-09-23 ENCOUNTER — Other Ambulatory Visit: Payer: Self-pay | Admitting: Hematology and Oncology

## 2022-09-23 ENCOUNTER — Other Ambulatory Visit: Payer: Self-pay

## 2022-09-23 DIAGNOSIS — N6310 Unspecified lump in the right breast, unspecified quadrant: Secondary | ICD-10-CM

## 2022-09-24 ENCOUNTER — Ambulatory Visit: Admission: RE | Admit: 2022-09-24 | Payer: Self-pay | Source: Ambulatory Visit

## 2022-09-27 ENCOUNTER — Ambulatory Visit
Admission: RE | Admit: 2022-09-27 | Discharge: 2022-09-27 | Disposition: A | Discharge status: Home / Self Care | Payer: Self-pay | Source: Ambulatory Visit | Attending: Hematology and Oncology | Admitting: Hematology and Oncology

## 2022-09-27 DIAGNOSIS — N6310 Unspecified lump in the right breast, unspecified quadrant: Secondary | ICD-10-CM | POA: Insufficient documentation

## 2022-09-27 MED ORDER — GADOBUTROL 1 MMOL/ML IV SOLN
7.5000 mL | Freq: Once | INTRAVENOUS | Status: AC | PRN
  Administered 2022-09-27: 7.5 mL via INTRAVENOUS

## 2023-07-09 ENCOUNTER — Telehealth: Payer: Self-pay | Admitting: *Deleted

## 2023-08-19 ENCOUNTER — Ambulatory Visit: Payer: Self-pay

## 2023-08-23 ENCOUNTER — Ambulatory Visit
Admission: RE | Admit: 2023-08-23 | Discharge: 2023-08-23 | Disposition: A | Discharge status: Home / Self Care | Payer: Self-pay | Source: Ambulatory Visit | Attending: Obstetrics and Gynecology | Admitting: Obstetrics and Gynecology

## 2023-08-23 ENCOUNTER — Other Ambulatory Visit: Payer: Self-pay

## 2023-08-23 ENCOUNTER — Ambulatory Visit: Payer: Self-pay | Attending: Obstetrics and Gynecology | Admitting: *Deleted

## 2023-08-23 VITALS — BP 117/83 | Wt 183.8 lb

## 2023-08-23 DIAGNOSIS — N6310 Unspecified lump in the right breast, unspecified quadrant: Secondary | ICD-10-CM

## 2023-08-23 DIAGNOSIS — N644 Mastodynia: Secondary | ICD-10-CM

## 2023-08-23 DIAGNOSIS — Z1239 Encounter for other screening for malignant neoplasm of breast: Secondary | ICD-10-CM

## 2023-08-23 DIAGNOSIS — N6452 Nipple discharge: Secondary | ICD-10-CM

## 2023-08-23 NOTE — Progress Notes (Unsigned)
 [name] is a 38 y.o. female who presents to [facility] clinic today with {Blank single:19197::"no complaints","complaint of"} ***.    Pap Smear: Pap smear not completed today. Last Pap smear was *** at *** clinic and was {Blank single:19197::"normal","abnormal - ***"}. Per patient has {Blank single:19197::"no history","history"} of an abnormal Pap smear. Last Pap smear result {Blank single:19197::"is available in","is not available in"} Epic.   Physical exam: Breasts Breasts symmetrical. No skin abnormalities bilateral breasts. No nipple retraction bilateral breasts. No nipple discharge bilateral breasts. No lymphadenopathy. No lumps palpated bilateral breasts.      MS 3D DIAG MAMMO BILAT BR (aka MM) Result Date: 08/23/2023 CLINICAL DATA:  Patient presents for BI-RADS 3 follow-up of a RIGHT breast mass, initiated February 2023. Patient with strong family history of breast cancer EXAM: DIGITAL DIAGNOSTIC BILATERAL MAMMOGRAM WITH TOMOSYNTHESIS AND CAD; ULTRASOUND RIGHT BREAST LIMITED TECHNIQUE: Bilateral digital diagnostic mammography and breast tomosynthesis was performed. The images were evaluated with computer-aided detection. ; Targeted ultrasound examination of the right breast was performed COMPARISON:  Previous exam(s). ACR Breast Density Category c: The breasts are heterogeneously dense, which may obscure small masses. FINDINGS: No new suspicious findings are noted in the RIGHT breast. No suspicious mass, distortion, or microcalcifications are identified to suggest presence of malignancy bilaterally. Targeted ultrasound was performed of the RIGHT breast. At 4 o'clock 4 cm from nipple, there is revisualization of an oval circumscribed hypoechoic mass. It measures 8 x 2 x 6 mm, previously 9 x 7 x 3 mm. IMPRESSION: 1. Stable RIGHT breast mass for greater than 2 years, consistent with a benign etiology. 2. No mammographic evidence of malignancy bilaterally. RECOMMENDATION: Recommend annual high risk  screening protocol with annual mammogram (due in 1 year) as well as annual breast MRI with and without contrast/abbreviated breast MRI (due June 2025). I have discussed the findings and recommendations with the patient. If applicable, a reminder letter will be sent to the patient regarding the next appointment. BI-RADS CATEGORY  2: Benign. Electronically Signed   By: Clancy Crimes M.D.   On: 08/23/2023 11:14   MS 3D DIAG MAMMO BILAT BR (aka MM) Result Date: 08/10/2022 CLINICAL DATA:  Patient for follow-up of probably benign bilateral breast masses. This serves as a 2 year follow-up of the left breast mass and a 1 year follow-up of the right breast mass. EXAM: DIGITAL DIAGNOSTIC BILATERAL MAMMOGRAM WITH TOMOSYNTHESIS; ULTRASOUND LEFT BREAST LIMITED; ULTRASOUND RIGHT BREAST LIMITED TECHNIQUE: Bilateral digital diagnostic mammography and breast tomosynthesis was performed.; Targeted ultrasound examination of the left breast was performed.; Targeted ultrasound examination of the right breast was performed COMPARISON:  Previous exam(s). ACR Breast Density Category c: The breasts are heterogeneously dense, which may obscure small masses. FINDINGS: No concerning masses, calcifications or nonsurgical distortion identified within either breast. Targeted ultrasound is performed, showing a 9 x 4 x 10 mm oval hypoechoic mass left breast 1 o'clock position 1 cm from the nipple, stable when compared to prior exams, compatible with benign process. Within the right breast 4 o'clock position 4 cm from nipple there is a stable 9 x 3 x 5 mm oval hypoechoic mass, previously 9 x 7 x 3 mm. IMPRESSION: 1. Stable probably benign right breast mass 4 o'clock position. 2. Benign left breast mass 1 o'clock position given stability over time. RECOMMENDATION: Bilateral breast MRI for re-evaluation of the previously identified mass within the right breast on MRI dated 08/28/2020. Bilateral diagnostic mammography and right breast  ultrasound in 12 months to reassess  the probably benign right breast mass. I have discussed the findings and recommendations with the patient. If applicable, a reminder letter will be sent to the patient regarding the next appointment. BI-RADS CATEGORY  3: Probably benign. Electronically Signed   By: Jone Neither M.D.   On: 08/10/2022 14:52  MS DIGITAL DIAG TOMO BILAT Result Date: 05/28/2021 CLINICAL DATA:  One year follow-up of probably benign left breast mass. The patient is in need of a second-look ultrasound for enhancing right breast mass seen on MRI dated May 2022. Persistent spontaneous and nonspontaneous greenish nipple discharge. EXAM: DIGITAL DIAGNOSTIC BILATERAL MAMMOGRAM WITH TOMOSYNTHESIS AND CAD; ULTRASOUND RIGHT BREAST LIMITED; ULTRASOUND LEFT BREAST LIMITED TECHNIQUE: Bilateral digital diagnostic mammography and breast tomosynthesis was performed. The images were evaluated with computer-aided detection.; Targeted ultrasound examination of the right breast was performed; Targeted ultrasound examination of the left breast was performed. COMPARISON:  Previous exam(s). ACR Breast Density Category b: There are scattered areas of fibroglandular density. FINDINGS: Mammographically, there are no suspicious masses, areas of architectural distortion or microcalcifications in either breast. Stable left upper retroareolar 1 cm circumscribed mass. Targeted right breast ultrasound is performed demonstrating 4 o'clock 4 cm from the nipple hypoechoic circumscribed horizontally oriented septated mass measuring 0.9 x 0.7 x 0.3 cm. This finding likely corresponds to the mass seen on patient's MRI. Targeted left breast ultrasound is performed demonstrating 1 o'clock 1 cm from the nipple stable hypoechoic circumscribed mass measuring 1.2 x 0.5 x 0.9 cm. IMPRESSION: Right breast 4 o'clock probably benign mass, for which six-month follow-up is recommended. Left breast 1 o'clock probably benign mass for which 12 month  follow-up is recommended. RECOMMENDATION: Focused right breast ultrasound in 6 months. I have discussed the findings and recommendations with the patient. If applicable, a reminder letter will be sent to the patient regarding the next appointment. BI-RADS CATEGORY  3: Probably benign. Electronically Signed   By: Dobrinka  Dimitrova M.D.   On: 05/28/2021 12:22  MS DIGITAL DIAG TOMO BILAT Result Date: 02/28/2020 CLINICAL DATA:  One year history of nipple discharge with manipulation. Strong family history of breast cancer. Lifetime risk of 21%. Patient also reports a palpable in the LEFT axilla after COVID vaccination which has since resolved. EXAM: DIGITAL DIAGNOSTIC BILATERAL MAMMOGRAM WITH TOMO AND CAD; ULTRASOUND RIGHT BREAST LIMITED; ULTRASOUND LEFT BREAST LIMITED COMPARISON:  None. ACR Breast Density Category c: The breast tissue is heterogeneously dense, which may obscure small masses. FINDINGS: Spot compression tomosynthesis were obtained over bilateral retroareolar regions. There is a circumscribed 10 mm oval mass in the LEFT upper retroareolar breast. No suspicious mass is seen in the RIGHT retroareolar region. A questioned asymmetry in the RIGHT outer breast resolves with additional views consistent with superimposed tissue. A questioned asymmetry in the LEFT outer breast resolves with additional views, consistent with superimposed tissue. Mammographic images were processed with CAD. Targeted ultrasound was performed of the RIGHT retroareolar region. No suspicious cystic or solid mass is seen. No intraductal mass is seen. Targeted ultrasound was performed of the LEFT retroareolar region. No suspicious intraductal mass is seen. Targeted ultrasound was performed of the LEFT breast at 1 o'clock 1 cm from the nipple. There is an oval hypoechoic circumscribed mass which measures 9 x 12 x 6 mm. This corresponds to the site of diagnostic mammographic concern. At the site of previous palpable concern, there are  several benign LEFT axillary lymph nodes with echogenic hila and thin smooth cortices. No suspicious LEFT axillary lymph nodes are seen. IMPRESSION: 1.  There is a probably benign 12 mm mass in the LEFT breast at 1 o'clock anterior depth. Recommend follow-up ultrasound in 6 months to assess for stability. 2. No mammographic or sonographic etiology for bilateral nipple discharge is identified. Any further workup of the patient's symptoms should be based on the clinical assessment. 3. No suspicious LEFT axillary adenopathy. RECOMMENDATION: LEFT breast ultrasound in 6 months. This will establish 6 months of stability. The [employer] Society recommends annual MRI and mammography in patients with an estimated lifetime risk of developing breast cancer greater than 20 - 25%, or who are known or suspected to be positive for the breast cancer gene. I have discussed the findings and recommendations with the patient. If applicable, a reminder letter will be sent to the patient regarding the next appointment. BI-RADS CATEGORY  3: Probably benign. Electronically Signed   By: Clancy Crimes MD   On: 02/28/2020 10:23    Pelvic/Bimanual Pap is not indicated today    Smoking History: Patient has {Blank single:19197::"never smoked","is a former smoker","is a current smoker at *** packs per day"} ***referred to quit line.    Patient Navigation: Patient education provided. Access to services provided for patient through [facility] program.   Breast and Cervical Cancer Risk Assessment: Patient {Blank single:19197::"has","does not have"} family history of breast cancer, known genetic mutations, or radiation treatment to the chest before age 3. Patient {Blank single:19197::"has","does not have"} history of cervical dysplasia, immunocompromised, or DES exposure in-utero.  Risk Scores as of Encounter on 08/23/2023     Gregary Lean           5-year 0.33%   Lifetime 6.73%            Last calculated by Algie Ingle, LPN  on 4/0/9811 at  9:39 AM        A: [facility] exam without pap smear Complaint of ***  P: Referred patient to the [facility] for a {Blank single:19197::"screening","diagnostic"} mammogram. Appointment scheduled ***.  Stefan Edge, RN 08/23/2023 9:59 AM

## 2023-08-23 NOTE — Patient Instructions (Signed)
 Explained breast self awareness with Julia Oats. Patient is due for her Pap smear. Patient is currently on menstrual period. Patient scheduled Pap smear for Monday, September 27, 2023 at 1245. Referred patient to the [facility] for a diagnostic mammogram. Per recommendation Appointment scheduled Monday, Aug 23, 2023 at 1020. Patient aware of appointments and will be there. Julia Oats verbalized understanding.  Shilo Pauwels, Dela Favor, RN 10:00 AM

## 2023-09-27 ENCOUNTER — Ambulatory Visit: Payer: Self-pay

## 2023-11-22 ENCOUNTER — Other Ambulatory Visit: Payer: Self-pay

## 2023-11-22 ENCOUNTER — Ambulatory Visit: Payer: Self-pay | Attending: Obstetrics and Gynecology | Admitting: *Deleted

## 2023-11-22 VITALS — Wt 183.0 lb

## 2023-11-22 DIAGNOSIS — Z803 Family history of malignant neoplasm of breast: Secondary | ICD-10-CM

## 2023-11-22 DIAGNOSIS — Z124 Encounter for screening for malignant neoplasm of cervix: Secondary | ICD-10-CM

## 2023-11-22 DIAGNOSIS — N898 Other specified noninflammatory disorders of vagina: Secondary | ICD-10-CM

## 2023-11-22 DIAGNOSIS — Z01419 Encounter for gynecological examination (general) (routine) without abnormal findings: Secondary | ICD-10-CM

## 2023-11-22 NOTE — Patient Instructions (Signed)
 Explained to Laura Petersen if today's Pap smear is normal and HPV negative that her next Pap smear is due in one year. Let patient know will follow up with her within the next couple weeks with results of wet prep and Pap smear by phone. Laura Petersen verbalized understanding.  Victorious Kundinger, Wanda Ship, RN 1:43 PM

## 2023-11-22 NOTE — Progress Notes (Signed)
 [name] is a 38 y.o. [phone] female who presents to [facility] clinic today with no complaints.    Pap Smear: Pap smear completed today. Last Pap smear was 08/10/2022 at [facility] clinic and was normal with negative HPV. 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 Per patient has history of one other abnormal Pap smear when she was 38 years old that was HPV positive. Patient stated no follow up was completed. Last Pap smear result is available in Epic.    Pelvic/Bimanual Ext Genitalia No lesions, no swelling and no discharge observed on external genitalia.        Vagina Vagina pink and normal texture. No lesions and thick white discharge observed in vagina. Wet prep completed.       Cervix Cervix is present. Cervix pink and of normal texture. Thick white discharge observed on cervix.   Uterus Uterus is present and palpable. Tenderness on palpation. Uterus in normal position and normal size.        Adnexae Bilateral ovaries present and palpable. No tenderness on palpation.         Rectovaginal No rectal exam completed today since patient had no rectal complaints. No skin abnormalities observed on exam.     Smoking History: Patient is a former smoker that quit smoking in May 2021 and quit vaping in April 2025.   Patient Navigation: Patient education provided. Access to services provided for patient through [facility]CP program.    Breast and Cervical Cancer Risk Assessment: Patient has family history of two maternal aunts, maternal grandmother, and two paternal aunts having breast cancer. Patient has no known genetic mutations or history of radiation treatment to the chest before age 25. Patient has history of cervical dysplasia. Patient has no history of being immunocompromised or DES exposure in-utero.   Risk Scores as of Encounter on 11/22/2023     Alisa as of 08/23/2023            5-year 0.33%   Lifetime 6.73%            Last calculated by Rogerio Tempie SQUIBB, LPN on 07/22/7972 at  9:39 AM        A: B[facility]CP exam with pap smear No complaints.  Driscilla Wanda SQUIBB, RN 11/22/2023 1:43 PM

## 2023-11-25 ENCOUNTER — Other Ambulatory Visit: Payer: Self-pay

## 2023-11-25 ENCOUNTER — Telehealth: Payer: Self-pay

## 2023-11-25 LAB — CERVICOVAGINAL ANCILLARY ONLY
Bacterial Vaginitis (gardnerella): POSITIVE — AB
Candida Glabrata: NEGATIVE
Candida Vaginitis: NEGATIVE
Comment: NEGATIVE
Comment: NEGATIVE
Comment: NEGATIVE
Comment: NEGATIVE
Trichomonas: NEGATIVE

## 2023-11-25 MED ORDER — METRONIDAZOLE 500 MG PO TABS: 500.0000 mg | ORAL_TABLET | Freq: Two times a day (BID) | ORAL | 0 refills | Status: AC

## 2023-11-25 NOTE — Telephone Encounter (Signed)
 Patient informed Wet prep results, Positive for BV, needs rx Metronidazole  1 po bid x 7 days, avoid alcohol while taking meds. [facility] [address] [place], [place]. Rx sent to pharmacy. Patient verbalized understanding.

## 2023-11-29 ENCOUNTER — Telehealth: Payer: Self-pay

## 2023-11-29 LAB — CYTOLOGY - PAP
Comment: NEGATIVE
Diagnosis: NEGATIVE
Diagnosis: REACTIVE
High risk HPV: NEGATIVE

## 2023-11-29 NOTE — Telephone Encounter (Signed)
 Patient informed negative pap/hpv results, needs to repeat pap in 1 year (due to history of abnormal pap smears). Patient verbalized understanding.

## 2023-12-06 IMAGING — US US BREAST*R* LIMITED INC AXILLA
1 series · 5 of 5 positions shown · non-contrast
Comparison: Previous exam(s).

CLINICAL DATA: One year follow-up of probably benign left breast
mass.
TECHNIQUE: Bilateral digital diagnostic mammography and breast tomosynthesis
was performed. The images were evaluated with computer-aided
detection.; Targeted ultrasound examination of the right breast was
performed; Targeted ultrasound examination of the left breast was
performed.

[Series 1: us breast*right* limited inc axilla · 0.06mm/px · 5 of 5 slices shown]
[im 1/5]
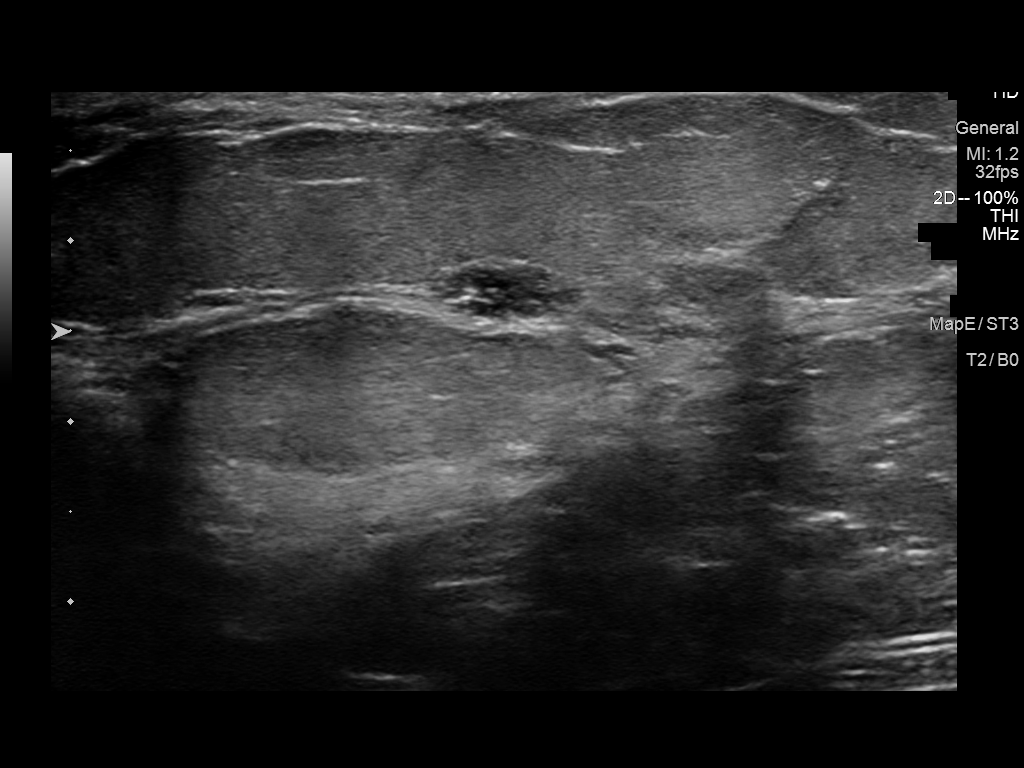
[im 2/5]
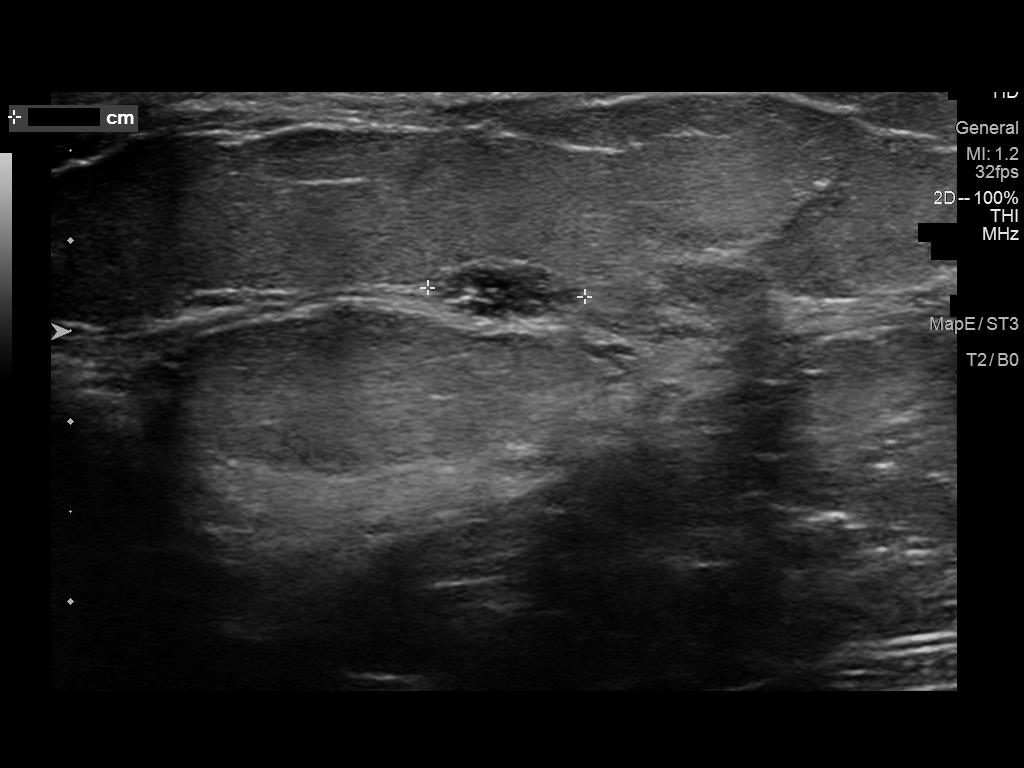
[im 3/5]
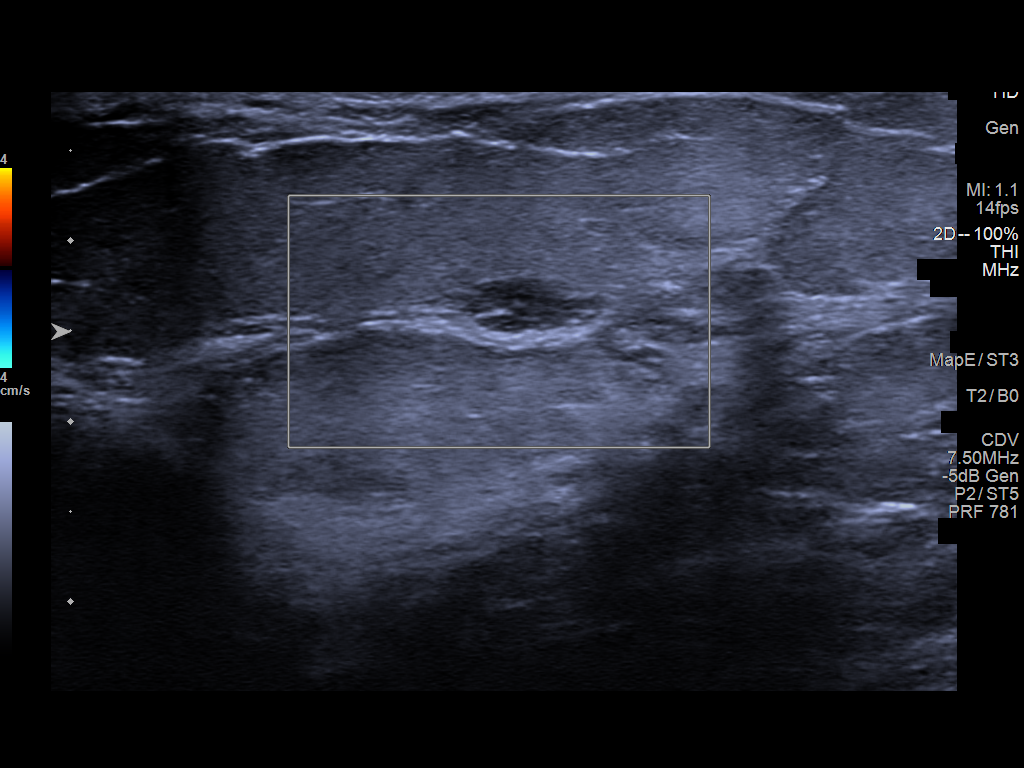
[im 4/5]
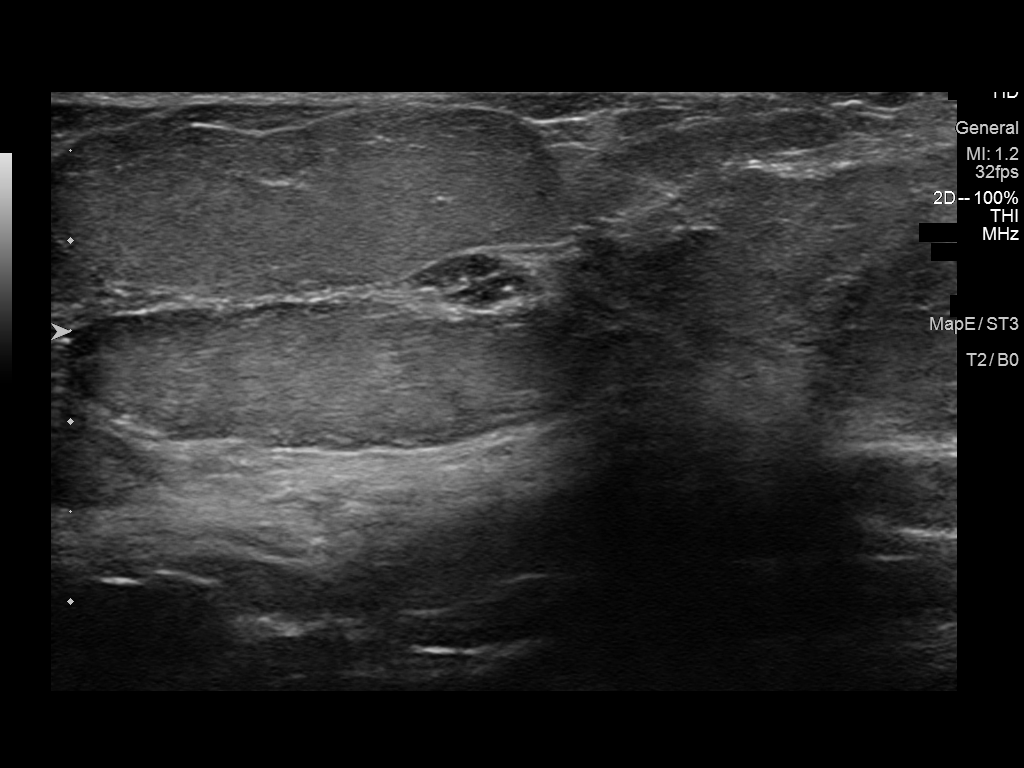
[im 5/5]
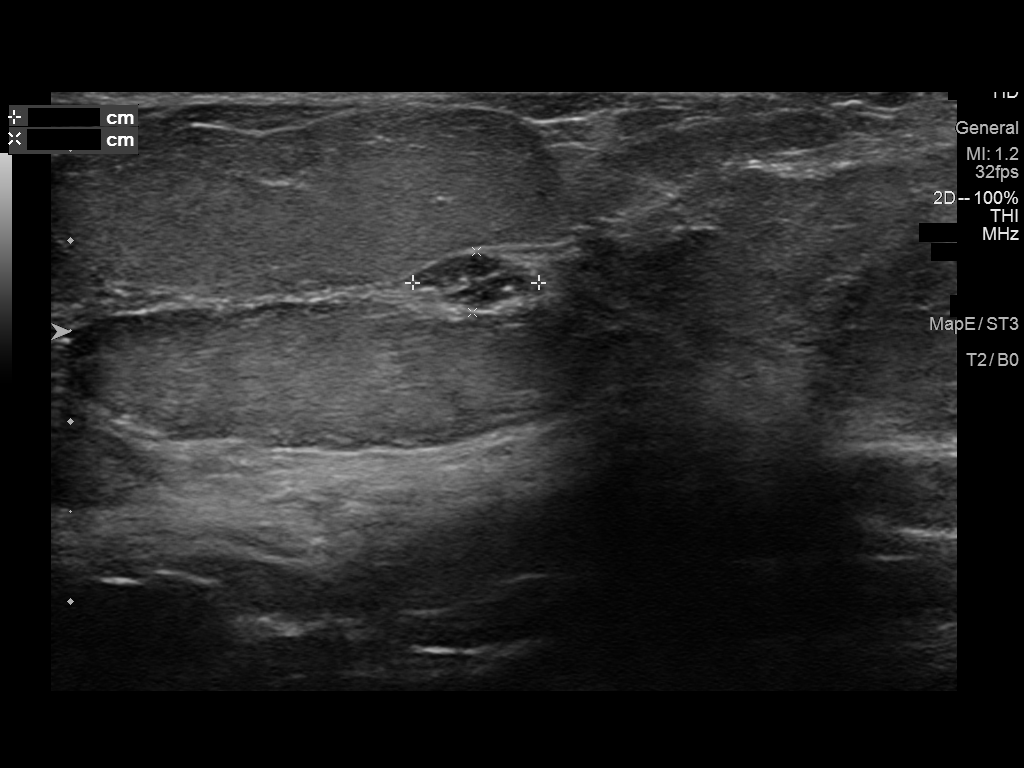

[5 of 5 positions shown; findings below may reference images not displayed]

The patient is in need of a second-look ultrasound for enhancing
right breast mass seen on MRI dated August 2020.

Persistent spontaneous and nonspontaneous greenish nipple discharge.

EXAM:
DIGITAL DIAGNOSTIC BILATERAL MAMMOGRAM WITH TOMOSYNTHESIS AND CAD;
ULTRASOUND RIGHT BREAST LIMITED; ULTRASOUND LEFT BREAST LIMITED
ACR Breast Density Category b: There are scattered areas of
fibroglandular density.
FINDINGS: Mammographically, there are no suspicious masses, areas of
architectural distortion or microcalcifications in either breast.
Stable left upper retroareolar 1 cm circumscribed mass.

Targeted right breast ultrasound is performed demonstrating 4
o'clock 4 cm from the nipple hypoechoic circumscribed horizontally
oriented septated mass measuring 0.9 x 0.7 x 0.3 cm. This finding
likely corresponds to the mass seen on patient's MRI.

Targeted left breast ultrasound is performed demonstrating 1 o'clock
1 cm from the nipple stable hypoechoic circumscribed mass measuring
1.2 x 0.5 x 0.9 cm.
IMPRESSION: Right breast 4 o'clock probably benign mass, for which six-month
follow-up is recommended.

Left breast 1 o'clock probably benign mass for which 12 month
follow-up is recommended.

RECOMMENDATION:
Focused right breast ultrasound in 6 months.

I have discussed the findings and recommendations with the patient.
If applicable, a reminder letter will be sent to the patient
regarding the next appointment.

BI-RADS CATEGORY  3: Probably benign.

## 2023-12-06 IMAGING — US US BREAST*L* LIMITED INC AXILLA
1 series · 5 of 5 positions shown · non-contrast
Comparison: Previous exam(s).

CLINICAL DATA: One year follow-up of probably benign left breast
mass.
TECHNIQUE: Bilateral digital diagnostic mammography and breast tomosynthesis
was performed. The images were evaluated with computer-aided
detection.; Targeted ultrasound examination of the right breast was
performed; Targeted ultrasound examination of the left breast was
performed.

[Series 1: us breast*left* limited inc axilla · 0.06mm/px · 5 of 5 slices shown]
[im 1/5]
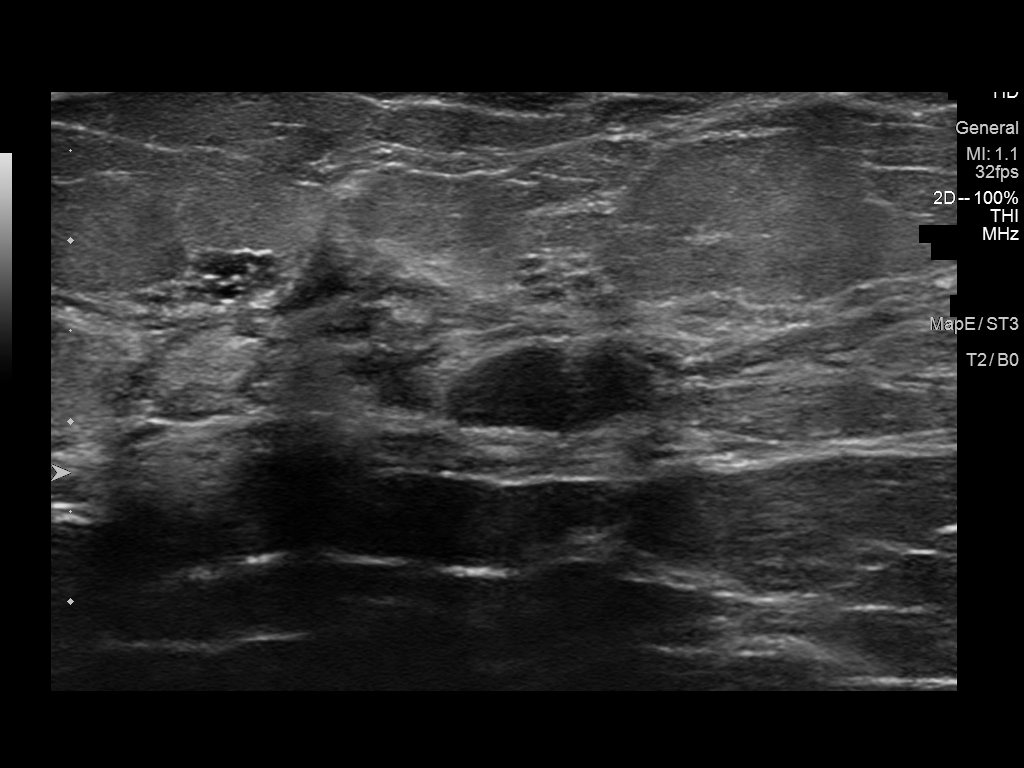
[im 2/5]
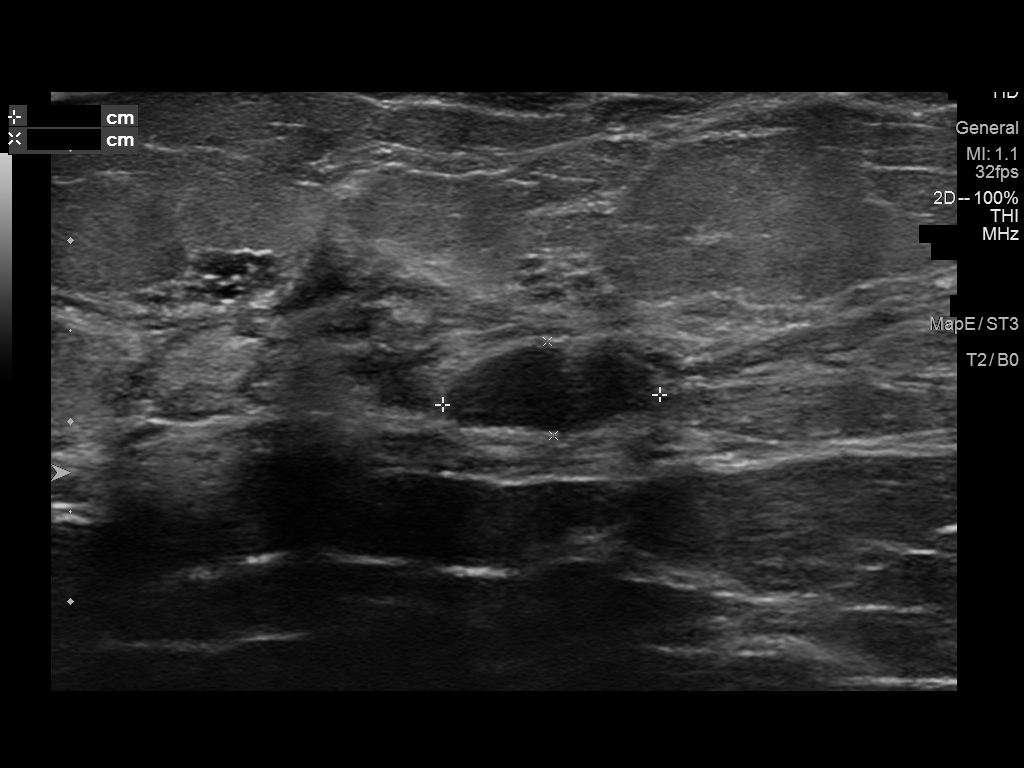
[im 3/5]
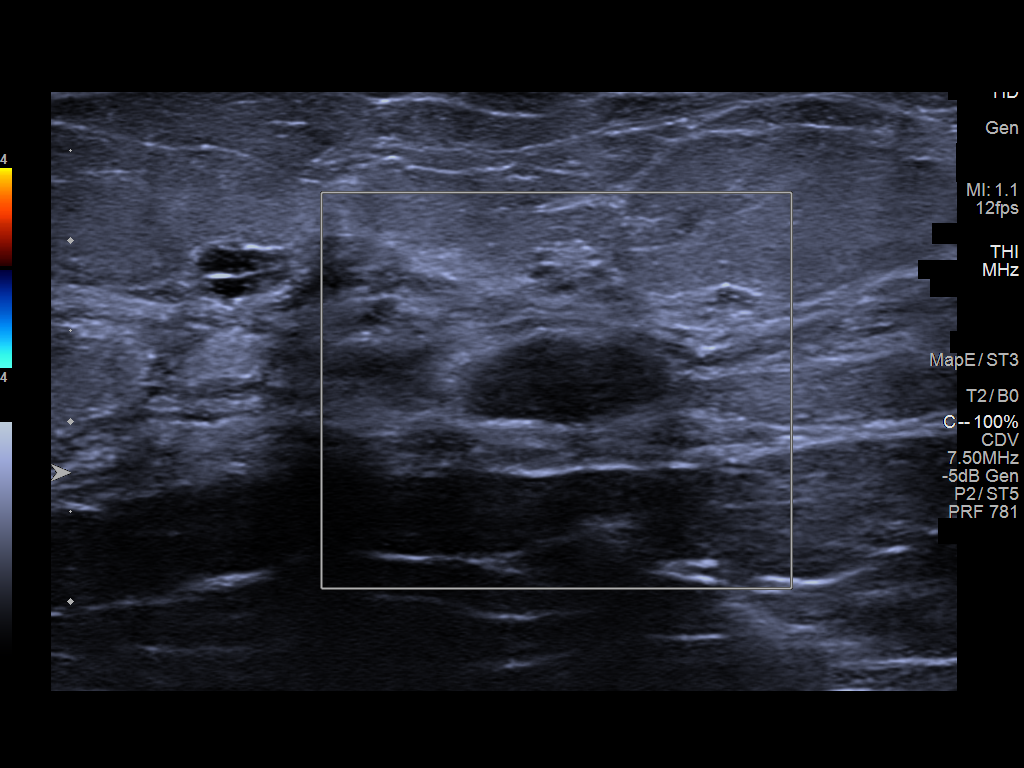
[im 4/5]
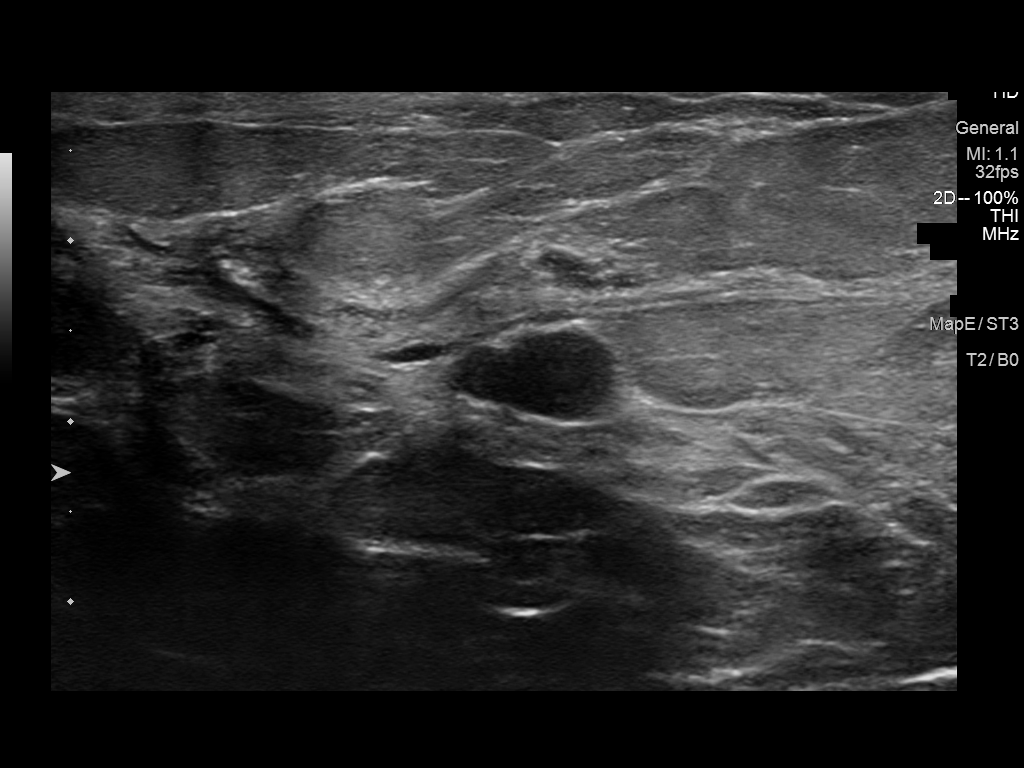
[im 5/5]
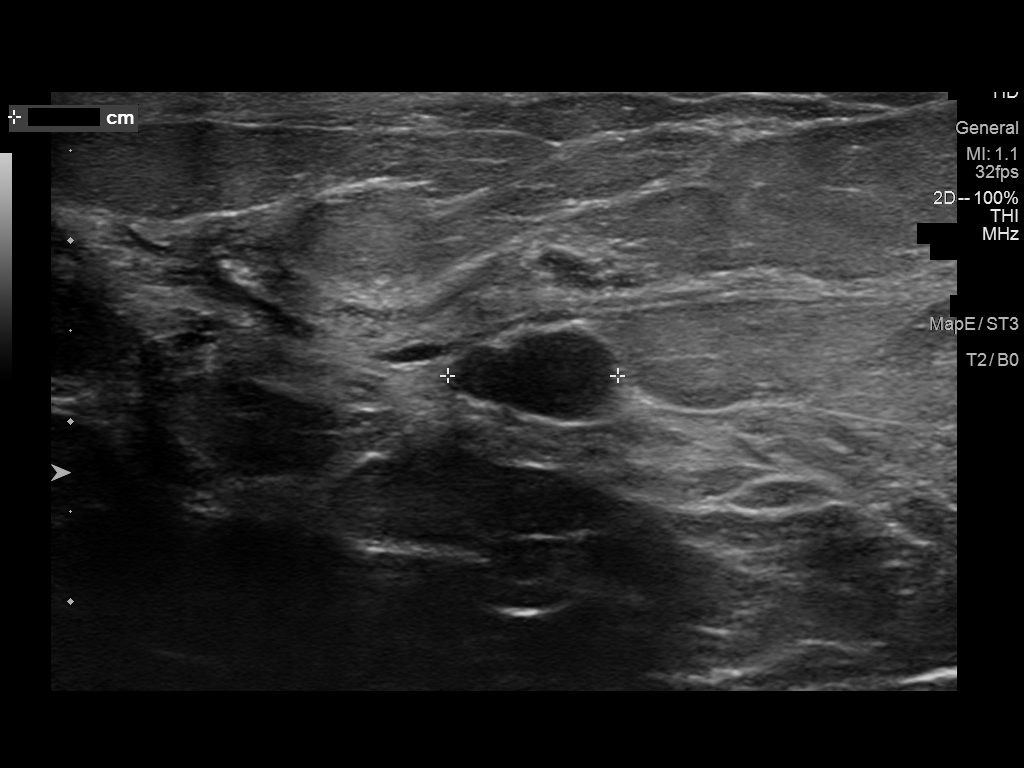

[5 of 5 positions shown; findings below may reference images not displayed]

The patient is in need of a second-look ultrasound for enhancing
right breast mass seen on MRI dated August 2020.

Persistent spontaneous and nonspontaneous greenish nipple discharge.

EXAM:
DIGITAL DIAGNOSTIC BILATERAL MAMMOGRAM WITH TOMOSYNTHESIS AND CAD;
ULTRASOUND RIGHT BREAST LIMITED; ULTRASOUND LEFT BREAST LIMITED
ACR Breast Density Category b: There are scattered areas of
fibroglandular density.
FINDINGS: Mammographically, there are no suspicious masses, areas of
architectural distortion or microcalcifications in either breast.
Stable left upper retroareolar 1 cm circumscribed mass.

Targeted right breast ultrasound is performed demonstrating 4
o'clock 4 cm from the nipple hypoechoic circumscribed horizontally
oriented septated mass measuring 0.9 x 0.7 x 0.3 cm. This finding
likely corresponds to the mass seen on patient's MRI.

Targeted left breast ultrasound is performed demonstrating 1 o'clock
1 cm from the nipple stable hypoechoic circumscribed mass measuring
1.2 x 0.5 x 0.9 cm.
IMPRESSION: Right breast 4 o'clock probably benign mass, for which six-month
follow-up is recommended.

Left breast 1 o'clock probably benign mass for which 12 month
follow-up is recommended.

RECOMMENDATION:
Focused right breast ultrasound in 6 months.

I have discussed the findings and recommendations with the patient.
If applicable, a reminder letter will be sent to the patient
regarding the next appointment.

BI-RADS CATEGORY  3: Probably benign.

## 2023-12-06 IMAGING — MG DIGITAL DIAGNOSTIC BILAT W/ TOMO W/ CAD
8 series · 8 of 24 positions shown · non-contrast
Comparison: Previous exam(s).

CLINICAL DATA: One year follow-up of probably benign left breast
mass.
TECHNIQUE: Bilateral digital diagnostic mammography and breast tomosynthesis
was performed. The images were evaluated with computer-aided
detection.; Targeted ultrasound examination of the right breast was
performed; Targeted ultrasound examination of the left breast was
performed.

[R CC synth-2D]
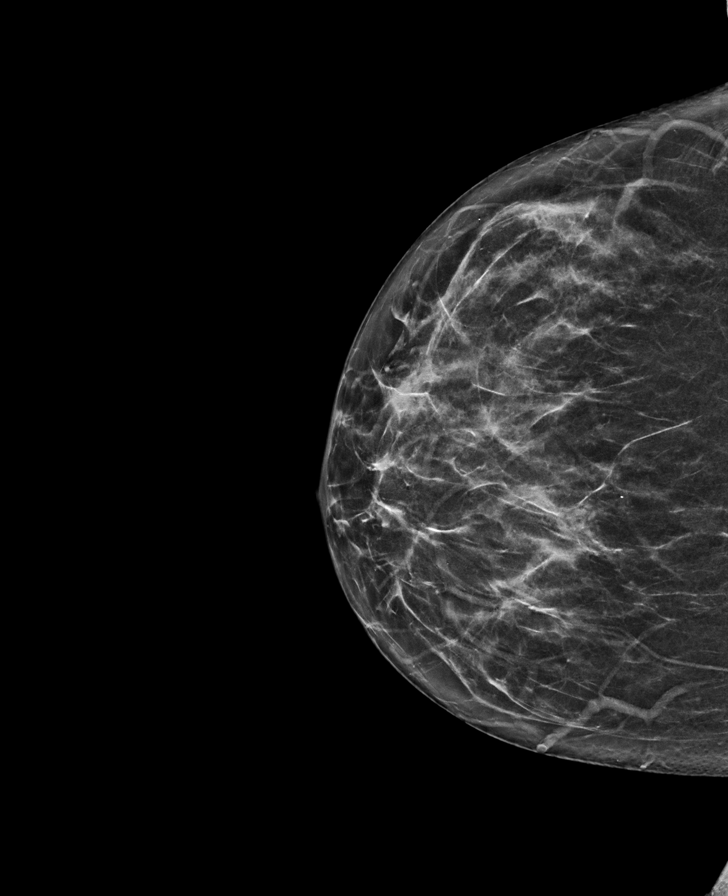

[L MLO synth-2D]
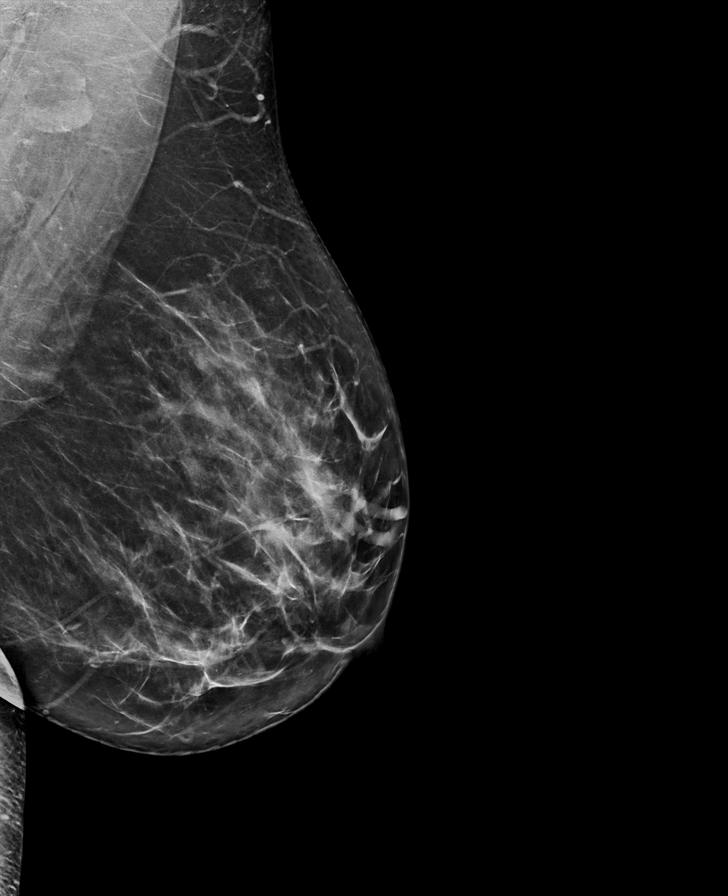

[L CC synth-2D]
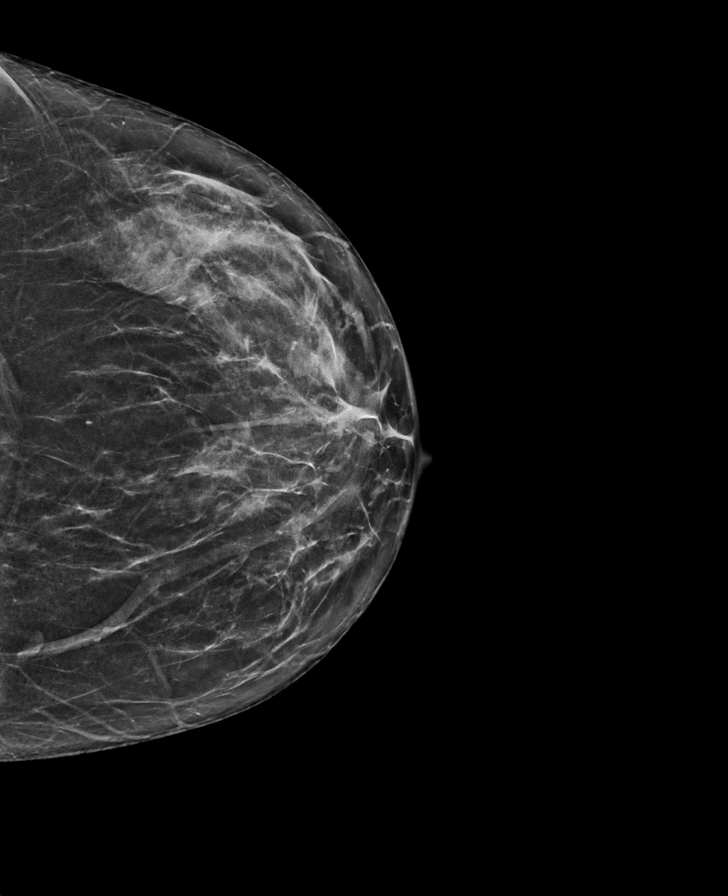

[R MLO synth-2D]
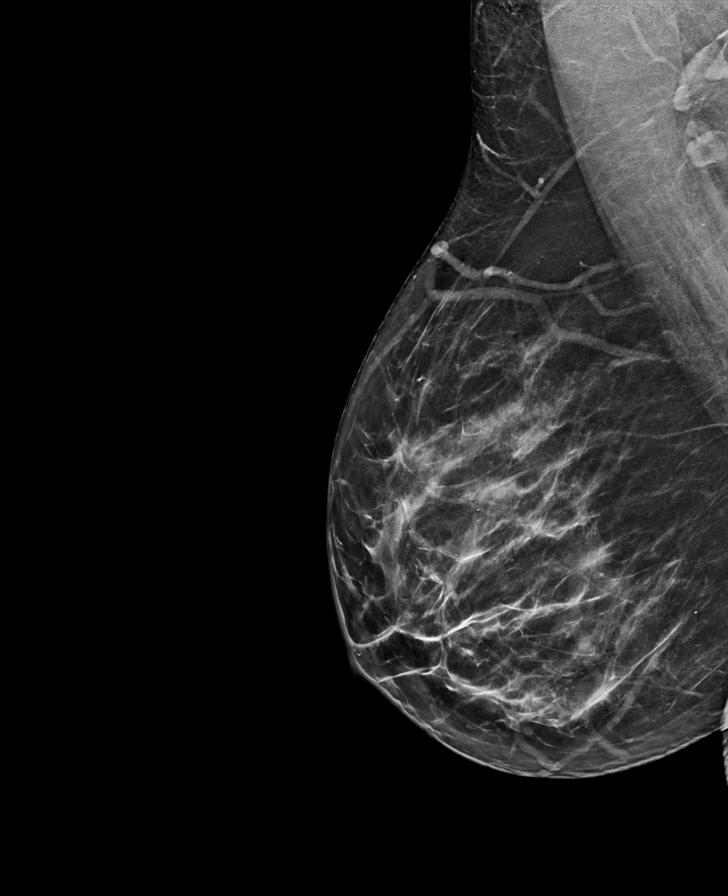

[L MLO tomo · tomo slice 40/79.0]
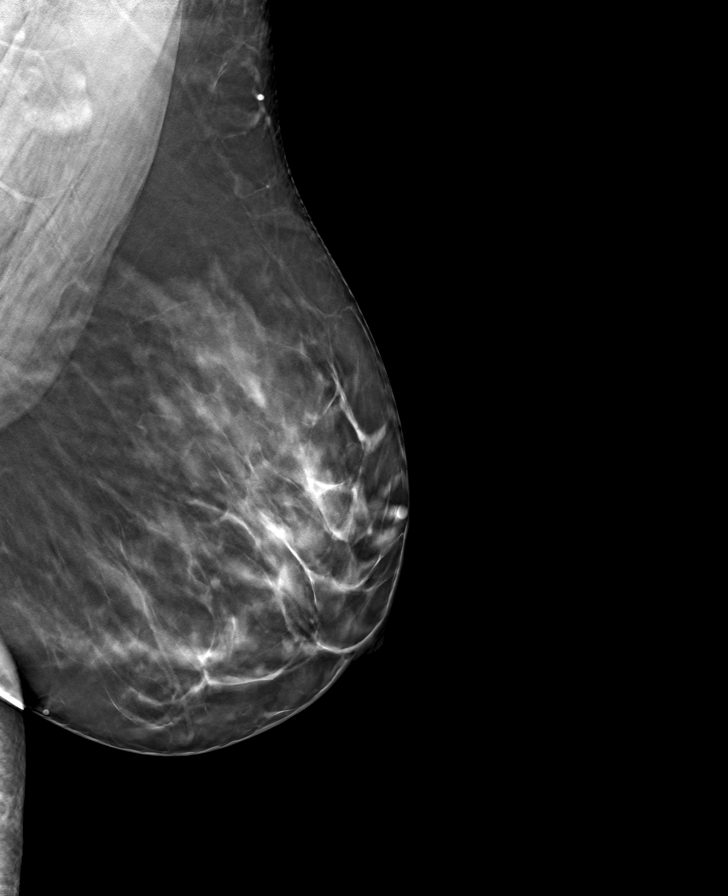

[R CC tomo · tomo slice 31/62.0]
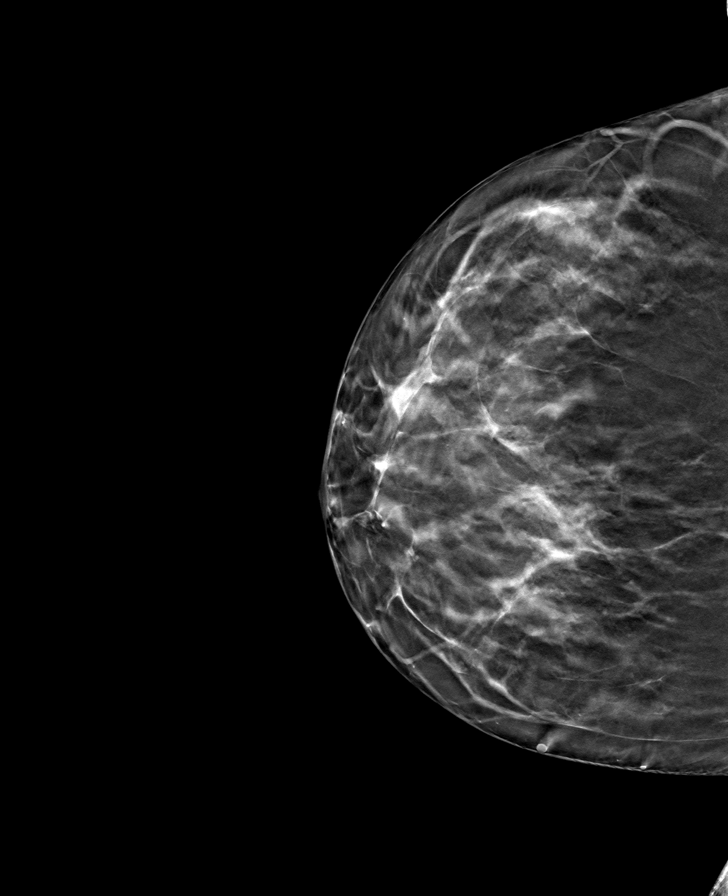

[R MLO tomo · tomo slice 37/74.0]
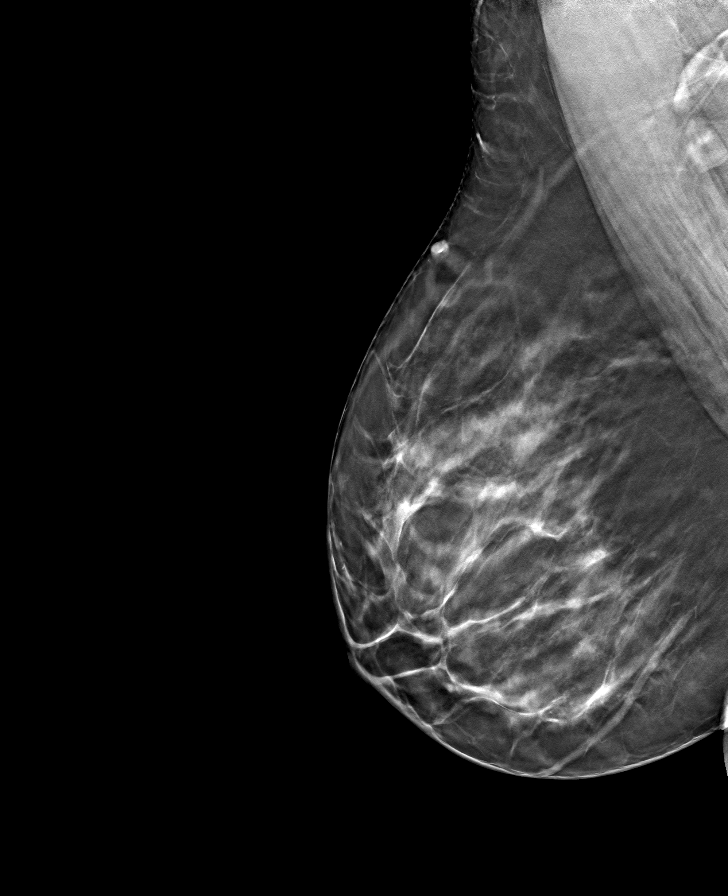

[L CC tomo · tomo slice 33/66.0]
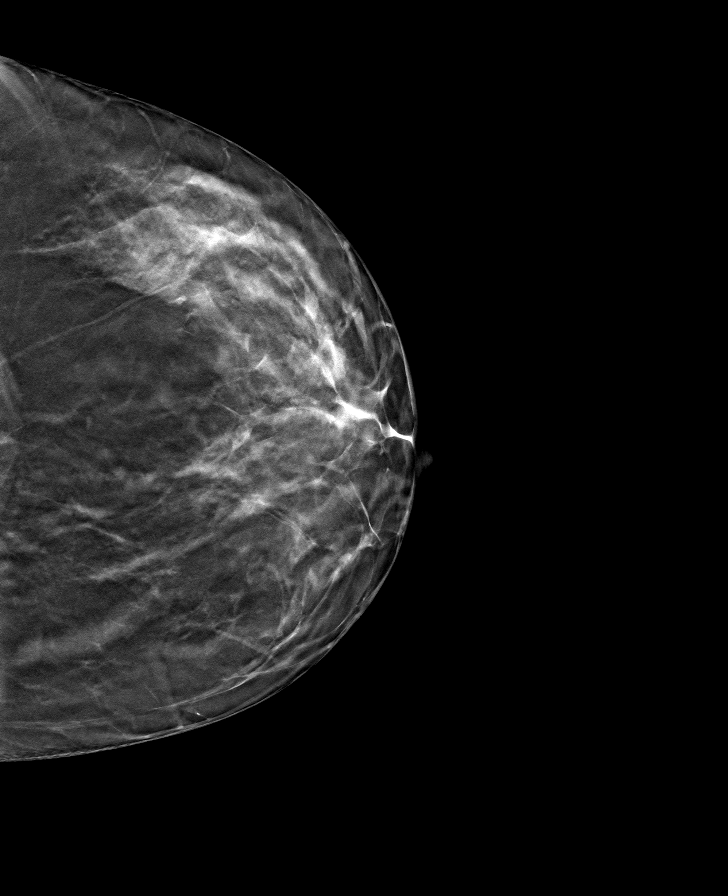

[8 of 24 positions shown; findings below may reference images not displayed]

The patient is in need of a second-look ultrasound for enhancing
right breast mass seen on MRI dated August 2020.

Persistent spontaneous and nonspontaneous greenish nipple discharge.

EXAM:
DIGITAL DIAGNOSTIC BILATERAL MAMMOGRAM WITH TOMOSYNTHESIS AND CAD;
ULTRASOUND RIGHT BREAST LIMITED; ULTRASOUND LEFT BREAST LIMITED
ACR Breast Density Category b: There are scattered areas of
fibroglandular density.
FINDINGS: Mammographically, there are no suspicious masses, areas of
architectural distortion or microcalcifications in either breast.
Stable left upper retroareolar 1 cm circumscribed mass.

Targeted right breast ultrasound is performed demonstrating 4
o'clock 4 cm from the nipple hypoechoic circumscribed horizontally
oriented septated mass measuring 0.9 x 0.7 x 0.3 cm. This finding
likely corresponds to the mass seen on patient's MRI.

Targeted left breast ultrasound is performed demonstrating 1 o'clock
1 cm from the nipple stable hypoechoic circumscribed mass measuring
1.2 x 0.5 x 0.9 cm.
IMPRESSION: Right breast 4 o'clock probably benign mass, for which six-month
follow-up is recommended.

Left breast 1 o'clock probably benign mass for which 12 month
follow-up is recommended.

RECOMMENDATION:
Focused right breast ultrasound in 6 months.

I have discussed the findings and recommendations with the patient.
If applicable, a reminder letter will be sent to the patient
regarding the next appointment.

BI-RADS CATEGORY  3: Probably benign.

## 2024-02-29 ENCOUNTER — Inpatient Hospital Stay: Payer: Self-pay | Attending: Genetic Counselor | Admitting: Genetic Counselor

## 2024-02-29 ENCOUNTER — Inpatient Hospital Stay: Payer: Self-pay

## 2024-02-29 ENCOUNTER — Encounter: Payer: Self-pay | Admitting: Genetic Counselor

## 2024-02-29 ENCOUNTER — Other Ambulatory Visit: Payer: Self-pay | Admitting: Genetic Counselor

## 2024-02-29 DIAGNOSIS — Z8041 Family history of malignant neoplasm of ovary: Secondary | ICD-10-CM

## 2024-02-29 DIAGNOSIS — Z803 Family history of malignant neoplasm of breast: Secondary | ICD-10-CM

## 2024-02-29 DIAGNOSIS — Z1379 Encounter for other screening for genetic and chromosomal anomalies: Secondary | ICD-10-CM

## 2024-02-29 DIAGNOSIS — Z800 Family history of malignant neoplasm of digestive organs: Secondary | ICD-10-CM

## 2024-02-29 LAB — GENETIC SCREENING ORDER

## 2024-02-29 NOTE — Progress Notes (Signed)
 REFERRING PROVIDER: Alger Gong, MD [address] [place],  [place] [address]  PRIMARY PROVIDER:  Patient, No Pcp Per  PRIMARY REASON FOR VISIT:  1. Family history of breast cancer   2. Family history of ovarian cancer   3. Family history of stomach cancer      HISTORY OF PRESENT ILLNESS:   Laura Petersen, a 38 y.o. female, was seen for a [place] cancer genetics consultation at the request of Dr. Alger due to a family history of cancer.  Laura Petersen presents to clinic today to discuss the possibility of a hereditary predisposition to cancer, genetic testing, and to further clarify her future cancer risks, as well as potential cancer risks for family members.   Laura Petersen is a 38 y.o. female with no personal history of cancer.    CANCER HISTORY:  Oncology History   No history exists.     RISK FACTORS:  Menarche was at age 25.  First live birth at age 4.  OCP use for approximately <5 years.  Ovaries intact: yes.  Hysterectomy: no.  Menopausal status: premenopausal.  HRT use: 0 years. Colonoscopy: no; not examined. Mammogram within the last year: yes. Number of breast biopsies: 0. Up to date with pelvic exams: yes. Any excessive radiation exposure in the past: no  Past Medical History:  Diagnosis Date   Allergy    Anxiety    Asthma    Depression    Family history of breast cancer    Family history of ovarian cancer    Family history of stomach cancer    GERD (gastroesophageal reflux disease)    Headache    Kidney stones    Seizures (HCC)     Past Surgical History:  Procedure Laterality Date   CESAREAN SECTION     CHOLECYSTECTOMY     TUBAL LIGATION     WISDOM TOOTH EXTRACTION  04/20/2012    Social History   Socioeconomic History   Marital status: Married    Spouse name: Not on file   Number of children: 3   Years of education: Not on file   Highest education level: Some college, no degree  Occupational History   Not on file  Tobacco  Use   Smoking status: Former    Current packs/day: 0.00    Average packs/day: 1 pack/day for 3.0 years (3.0 ttl pk-yrs)    Types: Cigarettes    Start date: 09/08/2016    Quit date: 09/09/2019    Years since quitting: 4.4   Smokeless tobacco: Never  Vaping Use   Vaping status: Every Day   Substances: Nicotine  Substance and Sexual Activity   Alcohol use: No   Drug use: No   Sexual activity: Yes    Birth control/protection: Surgical  Other Topics Concern   Not on file  Social History Narrative   Not on file   Social Drivers of Health   Financial Resource Strain: Not on file  Food Insecurity: No Food Insecurity (08/23/2023)   Hunger Vital Sign    Worried About Running Out of Food in the Last Year: Never true    Ran Out of Food in the Last Year: Never true  Transportation Needs: No Transportation Needs (08/23/2023)   PRAPARE - Administrator, Civil Service (Medical): No    Lack of Transportation (Non-Medical): No  Physical Activity: Not on file  Stress: Not on file  Social Connections: Not on file     FAMILY HISTORY:  We obtained a detailed, 4-generation family history.  Significant diagnoses are listed below: Family History  Problem Relation Age of Onset   Cervical cancer Mother    Leukemia Mother 2   Diabetes Mother    Asthma Mother    Diabetes Father    Breast cancer Maternal Aunt 68   Ovarian cancer Maternal Aunt    Esophageal cancer Maternal Uncle    Breast cancer Paternal Aunt 63       Neg GT   Breast cancer Maternal Grandmother 4       bilateral     The patient has three children who are cancer free.  She has three maternal half brothers who are cancer free.  Both parents are living.  The patients mother had leukemia when she was 85-38 years old and cervical cancer in her teens.  She has three sisters and four brothers.  One sister had breast cancer, a second sister had ovarian cancer.  The maternal grandmother had bilateral breast cancer.  Ms.  Petersen is unaware of previous family history of genetic testing for hereditary cancer risks. There is no reported Ashkenazi Jewish ancestry. There is no known consanguinity.  GENETIC COUNSELING ASSESSMENT: Laura Petersen is a 38 y.o. female with a family history of cancer which is somewhat suggestive of a hereditary cancer syndrome and predisposition to cancer given the combination of cancer. We, therefore, discussed and recommended the following at today's visit.   DISCUSSION: We discussed that, in general, most cancer is not inherited in families, but instead is sporadic or familial. Sporadic cancers occur by chance and typically happen at older ages (>50 years) as this type of cancer is caused by genetic changes acquired during an individual's lifetime. Some families have more cancers than would be expected by chance; however, the ages or types of cancer are not consistent with a known genetic mutation or known genetic mutations have been ruled out. This type of familial cancer is thought to be due to a combination of multiple genetic, environmental, hormonal, and lifestyle factors. While this combination of factors likely increases the risk of cancer, the exact source of this risk is not currently identifiable or testable.  We discussed that 5 - 10% of breast cancer is hereditary, with most cases associated with BRCA mutations.  There are other genes that can be associated with hereditary breast cancer syndromes.  These include ATM, CHEK2 and PALB2.  We discussed that testing is beneficial for several reasons including knowing how to follow individuals after completing their treatment, identifying whether potential treatment options such as PARP inhibitors would be beneficial, and understand if other family members could be at risk for cancer and allow them to undergo genetic testing.   We reviewed the characteristics, features and inheritance patterns of hereditary cancer syndromes. We also discussed genetic  testing, including the appropriate family members to test, the process of testing, insurance coverage and turn-around-time for results. We discussed the implications of a negative, positive, carrier and/or variant of uncertain significant result. Laura Petersen  was offered a common hereditary cancer panel (36+ genes) and an expanded pan-cancer panel (70+ genes). Laura Petersen was informed of the benefits and limitations of each panel, including that expanded pan-cancer panels contain genes that do not have clear management guidelines at this point in time.  We also discussed that as the number of genes included on a panel increases, the chances of variants of uncertain significance increases. Laura Petersen decided to pursue genetic testing for the CancerNext-Expanded+RNAinsight gene  panel.   The CancerNext-Expanded gene panel offered by [facility] and includes sequencing, rearrangement, and RNA analysis for the following 77 genes: AIP, ALK, APC, ATM, BAP1, BARD1, BMPR1A, BRCA1, BRCA2, BRIP1, CDC73, CDH1, CDK4, CDKN1B, CDKN2A, CEBPA, CHEK2, CTNNA1, DDX41, DICER1, ETV6, FH, FLCN, GATA2, LZTR1, MAX, MBD4, MEN1, MET, MLH1, MSH2, MSH3, MSH6, MUTYH, NF1, NF2, NTHL1, PALB2, PHOX2B, PMS2, POT1, PRKAR1A, PTCH1, PTEN, RAD51C, RAD51D, RB1, RET, RPS20, RUNX1, SDHA, SDHAF2, SDHB, SDHC, SDHD, SMAD4, SMARCA4, SMARCB1, SMARCE1, STK11, SUFU, TMEM127, TP53, TSC1, TSC2, VHL, and WT1 (sequencing and deletion/duplication); AXIN2, CTNNA1, DDX41, EGFR, HOXB13, KIT, MBD4, MITF, MSH3, PDGFRA, POLD1 and POLE (sequencing only); EPCAM and GREM1 (deletion/duplication only). RNA data is routinely analyzed for use in variant interpretation for all genes.   Based on Laura Petersen's family history of cancer, she meets medical criteria for genetic testing. Though Laura Petersen is not personally affected, there are no affected family members that are willing/able/available to undergo hereditary cancer testing.  Therefore, Laura Petersen the most informative family  member available.  Despite that she meets criteria, she may still have an out of pocket cost. We discussed that if her out of pocket cost for testing is over $100, the laboratory will call and confirm whether she wants to proceed with testing.  If the out of pocket cost of testing is less than $100 she will be billed by the genetic testing laboratory.   We discussed that some people do not want to undergo genetic testing due to fear of genetic discrimination.  The Genetic Information Nondiscrimination Act (GINA) was signed into federal law in 2008. GINA prohibits health insurers and most employers from discriminating against individuals based on genetic information (including the results of genetic tests and family history information). According to GINA, health insurance companies cannot consider genetic information to be a preexisting condition, nor can they use it to make decisions regarding coverage or rates. GINA also makes it illegal for most employers to use genetic information in making decisions about hiring, firing, promotion, or terms of employment. It is important to note that GINA does not offer protections for life insurance, disability insurance, or long-term care insurance. GINA does not apply to those in the [employer], those who work for companies with less than 15 employees, and new life insurance or long-term disability insurance policies.  Health status due to a cancer diagnosis is not protected under GINA. More information about GINA can be found by visiting [employer]clients[employer]be.  The Tyrer-Cuzick model is one of multiple prediction models developed to estimate an individual's lifetime risk of developing breast cancer. The Tyrer-Cuzick model is endorsed by the [employer] (NCCN). This model includes many risk factors such as family history, endogenous estrogen exposure, and benign breast disease. The calculation is highly-dependent on the accuracy of clinical data  provided by the patient and can change over time. The Tyrer-Cuzick model may be repeated to reflect new information in her personal or family history in the future.   Based on the patient's family history, a statistical model [employer]) was used to estimate her risk of developing breast cancer. This estimates her lifetime risk of developing breast cancer to be approximately 17.9%. This estimation does not consider any genetic testing results.  The patient's lifetime breast cancer risk is a preliminary estimate based on available information using one of several models endorsed by the American Cancer Society (ACS). The ACS recommends consideration of breast MRI screening as an adjunct to mammography for patients at high risk (defined  as 20% or greater lifetime risk). Please note that a woman's breast cancer risk changes over time. It may increase or decrease based on age and any changes to the personal and/or family medical history. The risks and recommendations listed above apply to this patient at this point in time. In the future, she may or may not be eligible for the same medical management strategies and, in some cases, other medical management strategies may become available to her. If she is interested in an updated breast cancer risk assessment at a later date, she can contact us .   PLAN: After considering the risks, benefits, and limitations, Laura Petersen provided informed consent to pursue genetic testing and the blood sample was sent to [facility] for analysis of the CancerNext-Expanded+RNAinsight. Results should be available within approximately 2-3 weeks' time, at which point they will be disclosed by telephone to Laura Petersen, as will any additional recommendations warranted by these results. Laura Petersen will receive a summary of her genetic counseling visit and a copy of her results once available. This information will also be available in Epic.   Lastly, we encouraged Laura Petersen  to remain in contact with cancer genetics annually so that we can continuously update the family history and inform her of any changes in cancer genetics and testing that may be of benefit for this family.   Laura Petersen questions were answered to her satisfaction today. Our contact information was provided should additional questions or concerns arise. Thank you for the referral and allowing us  to share in the care of your patient.   Mairi Stagliano P. Perri, MS, CGC Licensed, Patent Attorney [email or web address] phone: [phone]  I personally spent a total of 55 minutes in the care of the patient today including preparing to see the patient, getting/reviewing separately obtained history, counseling and educating, placing orders, and documenting clinical information in the EHR. The patient was seen alone.  Drs. Lanny Stalls, and/or Gudena were available for questions, if needed..    _______________________________________________________________________ For Office Staff:  Number of people involved in session: 2 Was an Intern/ student involved with case: no

## 2024-03-08 ENCOUNTER — Encounter: Payer: Self-pay | Admitting: Genetic Counselor

## 2024-03-08 ENCOUNTER — Telehealth: Payer: Self-pay | Admitting: Genetic Counselor

## 2024-03-08 DIAGNOSIS — Z1379 Encounter for other screening for genetic and chromosomal anomalies: Secondary | ICD-10-CM | POA: Insufficient documentation

## 2024-03-08 NOTE — Telephone Encounter (Signed)
 LM for patient to call back to discuss genetic test results. Provided call back number.

## 2024-03-10 ENCOUNTER — Telehealth: Payer: Self-pay | Admitting: Genetic Counselor

## 2024-03-10 NOTE — Telephone Encounter (Signed)
 I contacted  Laura Petersen to discuss her genetic testing results. No pathogenic variants were identified in the 77 genes analyzed. Discussed that we do not know why she has cancer in the family. It could be due to a different gene that we are not testing, or maybe our current technology may not be able to pick something up.  It will be important for her to keep in contact with genetics to keep up with whether additional testing may be needed.Detailed clinic note to follow.  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   The test report will be scanned into EPIC and will be located under the Molecular Pathology section of the Results Review tab.  A portion of the result report is included below for reference.

## 2024-03-15 ENCOUNTER — Telehealth: Payer: Self-pay | Admitting: Genetic Counselor

## 2024-03-15 ENCOUNTER — Ambulatory Visit: Payer: Self-pay | Admitting: Genetic Counselor

## 2024-03-15 DIAGNOSIS — Z1379 Encounter for other screening for genetic and chromosomal anomalies: Secondary | ICD-10-CM

## 2024-03-15 NOTE — Progress Notes (Signed)
 HPI:  [name] was previously seen in the [facility] due to a family history of cancer and concerns regarding a hereditary predisposition to cancer. Please refer to our prior cancer genetics clinic note for more information regarding our discussion, assessment and recommendations, at the time. [name] recent genetic test results were disclosed to her, as were recommendations warranted by these results. These results and recommendations are discussed in more detail below.  CANCER HISTORY:  Oncology History   No history exists.    FAMILY HISTORY:  We obtained a detailed, 4-generation family history.  Significant diagnoses are listed below: Family History  Problem Relation Age of Onset   Cervical cancer Mother    Leukemia Mother 2   Diabetes Mother    Asthma Mother    Diabetes Father    Breast cancer Maternal Aunt 51   Ovarian cancer Maternal Aunt    Esophageal cancer Maternal Uncle    Breast cancer Paternal Aunt 85       Neg GT   Breast cancer Maternal Grandmother 74       bilateral       The patient has three children who are cancer free.  She has three maternal half brothers who are cancer free.  Both parents are living.   The patients mother had leukemia when she was 55-82 years old and cervical cancer in her teens.  She has three sisters and four brothers.  One sister had breast cancer, a second sister had ovarian cancer.  The maternal grandmother had bilateral breast cancer.   [name] is unaware of previous family history of genetic testing for hereditary cancer risks. There is no reported Ashkenazi Jewish ancestry. There is no known consanguinity.  GENETIC TEST RESULTS: Genetic testing reported out on March 07, 2024 through the CancerNext-Expanded+RNAinsight cancer panel found no pathogenic mutations. The CancerNext-Expanded gene panel offered by [facility] and includes sequencing, rearrangement, and RNA analysis for the following 77 genes:  AIP, ALK, APC, ATM, BAP1, BARD1, BMPR1A, BRCA1, BRCA2, BRIP1, CDC73, CDH1, CDK4, CDKN1B, CDKN2A, CEBPA, CHEK2, CTNNA1, DDX41, DICER1, ETV6, FH, FLCN, GATA2, LZTR1, MAX, MBD4, MEN1, MET, MLH1, MSH2, MSH3, MSH6, MUTYH, NF1, NF2, NTHL1, PALB2, PHOX2B, PMS2, POT1, PRKAR1A, PTCH1, PTEN, RAD51C, RAD51D, RB1, RET, RPS20, RUNX1, SDHA, SDHAF2, SDHB, SDHC, SDHD, SMAD4, SMARCA4, SMARCB1, SMARCE1, STK11, SUFU, TMEM127, TP53, TSC1, TSC2, VHL, and WT1 (sequencing and deletion/duplication); AXIN2, CTNNA1, DDX41, EGFR, HOXB13, KIT, MBD4, MITF, MSH3, PDGFRA, POLD1 and POLE (sequencing only); EPCAM and GREM1 (deletion/duplication only). RNA data is routinely analyzed for use in variant interpretation for all genes. The test report has been scanned into EPIC and is located under the Molecular Pathology section of the Results Review tab.  A portion of the result report is included below for reference.     We discussed with [name] that because current genetic testing is not perfect, it is possible there may be a gene mutation in one of these genes that current testing cannot detect, but that chance is small.  We also discussed, that there could be another gene that has not yet been discovered, or that we have not yet tested, that is responsible for the cancer diagnoses in the family. It is also possible there is a hereditary cause for the cancer in the family that [name] did not inherit and therefore was not identified in her testing.  Therefore, it is important to remain in touch with cancer genetics in the future so that we can continue  to offer [name] the most up to date genetic testing.   Genetic testing did identify a variant of uncertain significance (VUS) was identified in the PDGFRA gene called [id number].T904I ([id number]).  At this time, it is unknown if this variant is associated with increased cancer risk or if this is a normal finding, but most variants such as this get reclassified to being inconsequential. It should  not be used to make medical management decisions. With time, we suspect the lab will determine the significance of this variant, if any. If we do learn more about it, we will try to contact [name] to discuss it further. However, it is important to stay in touch with us  periodically and keep the address and phone number up to date.  ADDITIONAL GENETIC TESTING: We discussed with [name] that her genetic testing was fairly extensive.  If there are genes identified to increase cancer risk that can be analyzed in the future, we would be happy to discuss and coordinate this testing at that time.    CANCER SCREENING RECOMMENDATIONS: [name] test result is considered negative (normal).  This means that we have not identified a hereditary cause for her family history of cancer at this time.   Possible reasons for [name]'s negative genetic test include:  1. There may be a gene mutation in one of these genes that current testing methods cannot detect but that chance is small.  2. There could be another gene that has not yet been discovered, or that we have not yet tested, that is responsible for the cancer diagnoses in the family.  3.  There may be no hereditary risk for cancer in the family. The cancers in [name] and/or her family may be sporadic/familial or due to other genetic and environmental factors. 4. It is also possible there is a hereditary cause for the cancer in the family that [name] did not inherit.  Therefore, it is recommended she continue to follow the cancer management and screening guidelines provided by her primary healthcare provider. An individual's cancer risk and medical management are not determined by genetic test results alone. Overall cancer risk assessment incorporates additional factors, including personal medical history, family history, and any available genetic information that may result in a personalized plan for cancer prevention and  surveillance  RECOMMENDATIONS FOR FAMILY MEMBERS:   Since she did not inherit a identifiable mutation in a cancer predisposition gene included on this panel, her children could not have inherited a known mutation from her in one of these genes. Individuals in this family might be at some increased risk of developing cancer, over the general population risk, simply due to the family history of cancer.  We recommended women in this family have a yearly mammogram beginning at age 65, or 35 years younger than the earliest onset of cancer, an annual clinical breast exam, and perform monthly breast self-exams. Women in this family should also have a gynecological exam as recommended by their primary provider. All family members should be referred for colonoscopy starting at age 42, or 37 years younger than the earliest onset of cancer.  FOLLOW-UP: Lastly, we discussed with [name] that cancer genetics is a rapidly advancing field and it is possible that new genetic tests will be appropriate for her and/or her family members in the future. We encouraged her to remain in contact with cancer genetics on an annual basis so we can update her personal and family histories and let her know  of advances in cancer genetics that may benefit this family.   Our contact number was provided. [name] questions were answered to her satisfaction, and she knows she is welcome to call us  at anytime with additional questions or concerns.   Darice Monte, MS, [facility] Licensed, Certified Genetic Counselor [email or web address]

## 2024-03-15 NOTE — Telephone Encounter (Signed)
 I contacted  Laura Petersen to discuss her genetic testing results. No pathogenic variants were identified in the 77 genes analyzed. Discussed that we do not know why she has cancer in the family. It could be due to a different gene that we are not testing, or maybe our current technology may not be able to pick something up.  It will be important for her to keep in contact with genetics to keep up with whether additional testing may be needed.Detailed clinic note to follow.   The test report will be scanned into EPIC and will be located under the Molecular Pathology section of the Results Review tab.  A portion of the result report is included below for reference.
